# Patient Record
Sex: Female | Born: 1980 | Race: Asian | Hispanic: No | Marital: Married | Smoking: Never smoker
Health system: Southern US, Community
[De-identification: ages and names within clinical notes are randomized; demographics above are authoritative.]

## PROBLEM LIST (undated history)

## (undated) ENCOUNTER — Inpatient Hospital Stay (HOSPITAL_COMMUNITY): Payer: Self-pay

## (undated) DIAGNOSIS — E079 Disorder of thyroid, unspecified: Secondary | ICD-10-CM

## (undated) DIAGNOSIS — E611 Iron deficiency: Secondary | ICD-10-CM

## (undated) HISTORY — DX: Iron deficiency: E61.1

---

## 2005-08-28 ENCOUNTER — Ambulatory Visit: Payer: Self-pay | Admitting: Gynecology

## 2005-08-28 ENCOUNTER — Inpatient Hospital Stay (HOSPITAL_COMMUNITY): Admission: EM | Admit: 2005-08-28 | Discharge: 2005-09-02 | Payer: Self-pay | Admitting: Obstetrics and Gynecology

## 2011-07-09 ENCOUNTER — Encounter (HOSPITAL_COMMUNITY): Payer: Self-pay | Admitting: *Deleted

## 2011-07-09 ENCOUNTER — Emergency Department (HOSPITAL_COMMUNITY)
Admission: EM | Admit: 2011-07-09 | Discharge: 2011-07-09 | Disposition: A | Discharge status: Home / Self Care | Payer: BC Managed Care – PPO | Source: Home / Self Care | Attending: Emergency Medicine | Admitting: Emergency Medicine

## 2011-07-09 DIAGNOSIS — S335XXA Sprain of ligaments of lumbar spine, initial encounter: Secondary | ICD-10-CM

## 2011-07-09 DIAGNOSIS — S39012A Strain of muscle, fascia and tendon of lower back, initial encounter: Secondary | ICD-10-CM

## 2011-07-09 HISTORY — DX: Disorder of thyroid, unspecified: E07.9

## 2011-07-09 LAB — POCT URINALYSIS DIP (DEVICE)
Glucose, UA: NEGATIVE mg/dL
Hgb urine dipstick: NEGATIVE
Leukocytes, UA: NEGATIVE
Nitrite: NEGATIVE
Protein, ur: NEGATIVE mg/dL
pH: 5.5 (ref 5.0–8.0)

## 2011-07-09 LAB — POCT PREGNANCY, URINE: Preg Test, Ur: NEGATIVE

## 2011-07-09 MED ORDER — TRAMADOL HCL 50 MG PO TABS: 100.0000 mg | ORAL_TABLET | Freq: Three times a day (TID) | ORAL | Status: AC | PRN

## 2011-07-09 MED ORDER — METHOCARBAMOL 500 MG PO TABS: 500.0000 mg | ORAL_TABLET | Freq: Three times a day (TID) | ORAL | Status: AC

## 2011-07-09 MED ORDER — NAPROXEN 375 MG PO TABS: 375.0000 mg | ORAL_TABLET | Freq: Two times a day (BID) | ORAL | Status: AC

## 2011-07-09 NOTE — ED Notes (Signed)
Pt  Reports  Low      Back pain  With  What  She  descibes  As   Some    Burning on  Urination  X  sev  Days   -  denys  Any  Sp[ecefic  Injury   She  Walks  upight with  Steady  Gait        Appears  In no   Severe  Distress

## 2011-07-09 NOTE — ED Provider Notes (Signed)
Chief Complaint  Patient presents with  . 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 Ever since yesterday she's had pain in the lower back. This might have been brought on by lifting heavy pots and pans. It's worse with heavy lifting and bending. She denies any radiation to the legs, numbness, tingling, or weakness in the legs. She has no bladder symptoms such as dysuria, frequency, urgency, or hematuria. She does note a little bit of aching in her abdomen and chest and feels tired and rundown. No fever or weight loss.  Review of Systems:  Other than noted above, the patient denies any of the following symptoms: Systemic:  No fever, chills, fatigue, or weight loss. GI:  No abdominal pain, nausea, vomiting, diarrhea, constipation or blood in stool. GU:  No dysuria, frequency, urgency, or hematuria. No incontinence or difficulty urinating.  M-S:  No neck pain, joint pain, arthritis, or myalgias. Neuro:  No parethesias or muscular weakness. Skin:  No rash or itching.   PMFSH:  Past medical history, family history, social history, meds, and allergies were reviewed.  Physical Exam:   Vital signs:  BP 116/72  Pulse 82  Temp(Src) 97.7 F (36.5 C) (Oral)  Resp 16  SpO2 99%  LMP 06/19/2011 General:  Alert, oriented, in no distress. Abdomen:  Soft, non-tender.  No organomegaly or mass.  No pulsatile midline abdominal mass or bruit. Back:  She has mild tenderness to palpation in the mid lumbar spine. The back has a very good range of motion with some pain on forward, lateral, backward bending. Straight leg raising is negative. Neuro:  Normal muscle strength, sensations and DTRs. Skin:  Clear, warm and dry.  No rash.  Labs:   Results for orders placed during the hospital encounter of 07/09/11  POCT URINALYSIS DIP (DEVICE)      Component Value Range   Glucose, UA NEGATIVE  NEGATIVE (mg/dL)   Bilirubin Urine NEGATIVE   NEGATIVE    Ketones, ur NEGATIVE  NEGATIVE (mg/dL)   Specific Gravity, Urine 1.020  1.005 - 1.030    Hgb urine dipstick NEGATIVE  NEGATIVE    pH 5.5  5.0 - 8.0    Protein, ur NEGATIVE  NEGATIVE (mg/dL)   Urobilinogen, UA 0.2  0.0 - 1.0 (mg/dL)   Nitrite NEGATIVE  NEGATIVE    Leukocytes, UA NEGATIVE  NEGATIVE   POCT PREGNANCY, URINE      Component Value Range   Preg Test, Ur NEGATIVE  NEGATIVE      Radiology:  No results found.  Assessment:   Diagnoses that have been ruled out:  None  Diagnoses that are still under consideration:  None  Final diagnoses:  Lumbar strain    Plan:   1.  The following meds were prescribed:   New Prescriptions   METHOCARBAMOL (ROBAXIN) 500 MG TABLET    Take 1 tablet (500 mg total) by mouth 3 (three) times daily.   NAPROXEN (NAPROSYN) 375 MG TABLET    Take 1 tablet (375 mg total) by mouth 2 (two) times daily with a meal.   TRAMADOL (ULTRAM) 50 MG TABLET    Take 2 tablets (100 mg total) by mouth every 8 (eight) hours as needed for pain.   2.  The patient was instructed in symptomatic care and handouts were given. 3.  The patient was told to return if becoming worse in any way, if no  better in 3 or 4 days, and given some red flag symptoms that would indicate earlier return.    Roque Lias, MD 07/09/11 [phone]

## 2011-07-09 NOTE — Discharge Instructions (Signed)
Back Exercises Back exercises help treat and prevent back injuries. The goal of back exercises is to increase the strength of your abdominal and back muscles and the flexibility of your back. These exercises should be started when you no longer have back pain. Back exercises include:  Pelvic Tilt. Lie on your back with your knees bent. Tilt your pelvis until the lower part of your back is against the floor. Hold this position 5 to 10 sec and repeat 5 to 10 times.   Knee to Chest. Pull first 1 knee up against your chest and hold for 20 to 30 seconds, repeat this with the other knee, and then both knees. This may be done with the other leg straight or bent, whichever feels better.   Sit-Ups or Curl-Ups. Bend your knees 90 degrees. Start with tilting your pelvis, and do a partial, slow sit-up, lifting your trunk only 30 to 45 degrees off the floor. Take at least 2 to 3 seconds for each sit-up. Do not do sit-ups with your knees out straight. If partial sit-ups are difficult, simply do the above but with only tightening your abdominal muscles and holding it as directed.   Hip-Lift. Lie on your back with your knees flexed 90 degrees. Push down with your feet and shoulders as you raise your hips a couple inches off the floor; hold for 10 seconds, repeat 5 to 10 times.   Back arches. Lie on your stomach, propping yourself up on bent elbows. Slowly press on your hands, causing an arch in your low back. Repeat 3 to 5 times. Any initial stiffness and discomfort should lessen with repetition over time.   Shoulder-Lifts. Lie face down with arms beside your body. Keep hips and torso pressed to floor as you slowly lift your head and shoulders off the floor.  Do not overdo your exercises, especially in the beginning. Exercises may cause you some mild back discomfort which lasts for a few minutes; however, if the pain is more severe, or lasts for more than 15 minutes, do not continue exercises until you see your  caregiver. Improvement with exercise therapy for back problems is slow.  See your caregivers for assistance with developing a proper back exercise program. Document Released: 05/28/2004 Document Revised: 04/09/2011 Document Reviewed: 04/20/2005 [employer] Patient Information 2012 [employer]. 

## 2012-03-29 ENCOUNTER — Encounter: Payer: Self-pay | Admitting: Family Medicine

## 2012-03-29 ENCOUNTER — Ambulatory Visit (INDEPENDENT_AMBULATORY_CARE_PROVIDER_SITE_OTHER): Payer: BC Managed Care – PPO | Admitting: Family Medicine

## 2012-03-29 VITALS — BP 100/64 | HR 80 | Temp 97.9°F | Ht 59.5 in | Wt 99.2 lb

## 2012-03-29 DIAGNOSIS — R5383 Other fatigue: Secondary | ICD-10-CM

## 2012-03-29 DIAGNOSIS — R5381 Other malaise: Secondary | ICD-10-CM

## 2012-03-29 DIAGNOSIS — D649 Anemia, unspecified: Secondary | ICD-10-CM

## 2012-03-29 DIAGNOSIS — N943 Premenstrual tension syndrome: Secondary | ICD-10-CM | POA: Insufficient documentation

## 2012-03-29 DIAGNOSIS — N390 Urinary tract infection, site not specified: Secondary | ICD-10-CM

## 2012-03-29 DIAGNOSIS — Z0000 Encounter for general adult medical examination without abnormal findings: Secondary | ICD-10-CM

## 2012-03-29 LAB — BASIC METABOLIC PANEL
BUN: 12 mg/dL (ref 6–23)
CO2: 27 mEq/L (ref 19–32)
Calcium: 8.8 mg/dL (ref 8.4–10.5)
Chloride: 105 mEq/L (ref 96–112)
GFR: 108.14 mL/min (ref >60.00)
Potassium: 3.9 mEq/L (ref 3.5–5.1)

## 2012-03-29 LAB — POCT URINALYSIS DIPSTICK
Bilirubin, UA: NEGATIVE
Glucose, UA: NEGATIVE
Nitrite, UA: NEGATIVE
Protein, UA: NEGATIVE
Spec Grav, UA: 1.01
Urobilinogen, UA: 0.2
pH, UA: 6

## 2012-03-29 LAB — LIPID PANEL
LDL Cholesterol: 73 mg/dL (ref 0–99)
Total CHOL/HDL Ratio: 3

## 2012-03-29 LAB — CBC WITH DIFFERENTIAL/PLATELET
Basophils Absolute: 0 10*3/uL (ref 0.0–0.1)
Basophils Relative: 0.6 % (ref 0.0–3.0)
HCT: 38.1 % (ref 36.0–46.0)
Hemoglobin: 12.4 g/dL (ref 12.0–15.0)
MCHC: 32.6 g/dL (ref 30.0–36.0)
Monocytes Absolute: 0.2 10*3/uL (ref 0.1–1.0)
Monocytes Relative: 4.5 % (ref 3.0–12.0)
Neutrophils Relative %: 65.3 % (ref 43.0–77.0)
RBC: 4.03 Mil/uL (ref 3.87–5.11)
WBC: 5.4 10*3/uL (ref 4.5–10.5)

## 2012-03-29 LAB — IBC PANEL
Iron: 82 ug/dL (ref 42–145)
Saturation Ratios: 26 % (ref 20.0–50.0)
Transferrin: 225 mg/dL (ref 212.0–360.0)

## 2012-03-29 LAB — TSH: TSH: 0.65 u[IU]/mL (ref 0.35–5.50)

## 2012-03-29 NOTE — Progress Notes (Signed)
Subjective:     Shelly Olsen is a 31 y.o. female and is here for a comprehensive physical exam. The patient reports problems - cramping with period and back pain.  Pt also c/o fatigue for many years.  .  History   Social History  . Marital Status: Married    Spouse Name: N/A    Number of Children: N/A  . Years of Education: N/A   Occupational History  . Not on file.   Social History Main Topics  . Smoking status: Never Smoker   . Smokeless tobacco: Not on file  . Alcohol Use: No  . Drug Use:   . Sexually Active: Yes   Other Topics Concern  . Not on file   Social History Narrative  . No narrative on file   No health maintenance topics applied.  The following portions of the patient's history were reviewed and updated as appropriate:  She  has a past medical history of Thyroid disease and Low iron. She  does not have a problem list on file. She  has no past surgical history on file. Her family history is not on file. She  reports that she has never smoked. She does not have any smokeless tobacco history on file. She reports that she does not drink alcohol. Her drug history not on file. She has a current medication list which includes the following prescription(s): naproxen and ferrous fumarate. Current Outpatient Prescriptions on File Prior to Visit  Medication Sig Dispense Refill  . naproxen (NAPROSYN) 375 MG tablet Take 1 tablet (375 mg total) by mouth 2 (two) times daily with a meal.  20 tablet  0  . ferrous fumarate (HEMOCYTE - 106 MG FE) 325 (106 FE) MG TABS Take 1 tablet by mouth.       She  has no known allergies..  Review of Systems Review of Systems  Constitutional: Negative for activity change, appetite change and fatigue.  HENT: Negative for hearing loss, congestion, tinnitus and ear discharge.  dentist --no Eyes: Negative for visual disturbance (see optho -no) Respiratory: Negative for cough, chest tightness and shortness of breath.   Cardiovascular:  Negative for chest pain, palpitations and leg swelling.  Gastrointestinal: Negative for abdominal pain, diarrhea, constipation and abdominal distention.  Genitourinary: Negative for urgency, frequency, decreased urine volume and difficulty urinating.  Musculoskeletal: Negative for back pain, arthralgias and gait problem.  Skin: Negative for color change, pallor and rash.  Neurological: Negative for dizziness, light-headedness, numbness and headaches.  Hematological: Negative for adenopathy. Does not bruise/bleed easily.  Psychiatric/Behavioral: Negative for suicidal ideas, confusion, sleep disturbance, self-injury, dysphoric mood, decreased concentration and agitation.       Objective:    BP 100/64  Pulse 80  Temp 97.9 F (36.6 C) (Oral)  Ht 4' 11.5" (1.511 m)  Wt 99 lb 3.2 oz (44.997 kg)  BMI 19.70 kg/m2  SpO2 99%  LMP 03/29/2012 General appearance: alert, cooperative, appears stated age and no distress Head: Normocephalic, without obvious abnormality, atraumatic Eyes: conjunctivae/corneas clear. PERRL, EOM's intact. Fundi benign. Ears: normal TM's and external ear canals both ears Nose: Nares normal. Septum midline. Mucosa normal. No drainage or sinus tenderness. Throat: lips, mucosa, and tongue normal; teeth and gums normal Neck: no adenopathy, supple, symmetrical, trachea midline and thyroid not enlarged, symmetric, no tenderness/mass/nodules Back: symmetric, no curvature. ROM normal. No CVA tenderness. Lungs: clear to auscultation bilaterally Breasts: normal appearance, no masses or tenderness Heart: regular rate and rhythm, S1, S2 normal, no murmur, click, rub  or gallop Abdomen: soft, non-tender; bowel sounds normal; no masses,  no organomegaly Pelvic: deferred Extremities: extremities normal, atraumatic, no cyanosis or edema Pulses: 2+ and symmetric Skin: Skin color, texture, turgor normal. No rashes or lesions Lymph nodes: Cervical, supraclavicular, and axillary nodes  normal. Neurologic: Grossly normal psych-- no anxiety/ depression    Assessment:    Healthy female exam.      Plan:    ghm utd Check labs See After Visit Summary for Counseling Recommendations

## 2012-03-29 NOTE — Addendum Note (Signed)
Addended by: Silvio Pate D on: 03/29/2012 03:30 PM   Modules accepted: Orders

## 2012-03-29 NOTE — Patient Instructions (Addendum)
Preventive Care for Adults, Female A healthy lifestyle and preventive care can promote health and wellness. Preventive health guidelines for women include the following key practices.  A routine yearly physical is a good way to check with your caregiver about your health and preventive screening. It is a chance to share any concerns and updates on your health, and to receive a thorough exam.  Visit your dentist for a routine exam and preventive care every 6 months. Brush your teeth twice a day and floss once a day. Good oral hygiene prevents tooth decay and gum disease.  The frequency of eye exams is based on your age, health, family medical history, use of contact lenses, and other factors. Follow your caregiver's recommendations for frequency of eye exams.  Eat a healthy diet. Foods like vegetables, fruits, whole grains, low-fat dairy products, and lean protein foods contain the nutrients you need without too many calories. Decrease your intake of foods high in solid fats, added sugars, and salt. Eat the right amount of calories for you.Get information about a proper diet from your caregiver, if necessary.  Regular physical exercise is one of the most important things you can do for your health. Most adults should get at least 150 minutes of moderate-intensity exercise (any activity that increases your heart rate and causes you to sweat) each week. In addition, most adults need muscle-strengthening exercises on 2 or more days a week.  Maintain a healthy weight. The body mass index (BMI) is a screening tool to identify possible weight problems. It provides an estimate of body fat based on height and weight. Your caregiver can help determine your BMI, and can help you achieve or maintain a healthy weight.For adults 20 years and older:  A BMI below 18.5 is considered underweight.  A BMI of 18.5 to 24.9 is normal.  A BMI of 25 to 29.9 is considered overweight.  A BMI of 30 and above is  considered obese.  Maintain normal blood lipids and cholesterol levels by exercising and minimizing your intake of saturated fat. Eat a balanced diet with plenty of fruit and vegetables. Blood tests for lipids and cholesterol should begin at age 20 and be repeated every 5 years. If your lipid or cholesterol levels are high, you are over 50, or you are at high risk for heart disease, you may need your cholesterol levels checked more frequently.Ongoing high lipid and cholesterol levels should be treated with medicines if diet and exercise are not effective.  If you smoke, find out from your caregiver how to quit. If you do not use tobacco, do not start.  If you are pregnant, do not drink alcohol. If you are breastfeeding, be very cautious about drinking alcohol. If you are not pregnant and choose to drink alcohol, do not exceed 1 drink per day. One drink is considered to be 12 ounces (355 mL) of beer, 5 ounces (148 mL) of wine, or 1.5 ounces (44 mL) of liquor.  Avoid use of street drugs. Do not share needles with anyone. Ask for help if you need support or instructions about stopping the use of drugs.  High blood pressure causes heart disease and increases the risk of stroke. Your blood pressure should be checked at least every 1 to 2 years. Ongoing high blood pressure should be treated with medicines if weight loss and exercise are not effective.  If you are 55 to 32 years old, ask your caregiver if you should take aspirin to prevent strokes.  Diabetes   screening involves taking a blood sample to check your fasting blood sugar level. This should be done once every 3 years, after age 45, if you are within normal weight and without risk factors for diabetes. Testing should be considered at a younger age or be carried out more frequently if you are overweight and have at least 1 risk factor for diabetes.  Breast cancer screening is essential preventive care for women. You should practice "breast  self-awareness." This means understanding the normal appearance and feel of your breasts and may include breast self-examination. Any changes detected, no matter how small, should be reported to a caregiver. Women in their 20s and 30s should have a clinical breast exam (CBE) by a caregiver as part of a regular health exam every 1 to 3 years. After age 40, women should have a CBE every year. Starting at age 40, women should consider having a mammography (breast X-ray test) every year. Women who have a family history of breast cancer should talk to their caregiver about genetic screening. Women at a high risk of breast cancer should talk to their caregivers about having magnetic resonance imaging (MRI) and a mammography every year.  The Pap test is a screening test for cervical cancer. A Pap test can show cell changes on the cervix that might become cervical cancer if left untreated. A Pap test is a procedure in which cells are obtained and examined from the lower end of the uterus (cervix).  Women should have a Pap test starting at age 21.  Between ages 21 and 29, Pap tests should be repeated every 2 years.  Beginning at age 30, you should have a Pap test every 3 years as long as the past 3 Pap tests have been normal.  Some women have medical problems that increase the chance of getting cervical cancer. Talk to your caregiver about these problems. It is especially important to talk to your caregiver if a new problem develops soon after your last Pap test. In these cases, your caregiver may recommend more frequent screening and Pap tests.  The above recommendations are the same for women who have or have not gotten the vaccine for human papillomavirus (HPV).  If you had a hysterectomy for a problem that was not cancer or a condition that could lead to cancer, then you no longer need Pap tests. Even if you no longer need a Pap test, a regular exam is a good idea to make sure no other problems are  starting.  If you are between ages 65 and 70, and you have had normal Pap tests going back 10 years, you no longer need Pap tests. Even if you no longer need a Pap test, a regular exam is a good idea to make sure no other problems are starting.  If you have had past treatment for cervical cancer or a condition that could lead to cancer, you need Pap tests and screening for cancer for at least 20 years after your treatment.  If Pap tests have been discontinued, risk factors (such as a new sexual partner) need to be reassessed to determine if screening should be resumed.  The HPV test is an additional test that may be used for cervical cancer screening. The HPV test looks for the virus that can cause the cell changes on the cervix. The cells collected during the Pap test can be tested for HPV. The HPV test could be used to screen women aged 30 years and older, and should   be used in women of any age who have unclear Pap test results. After the age of 30, women should have HPV testing at the same frequency as a Pap test.  Colorectal cancer can be detected and often prevented. Most routine colorectal cancer screening begins at the age of 50 and continues through age 75. However, your caregiver may recommend screening at an earlier age if you have risk factors for colon cancer. On a yearly basis, your caregiver may provide home test kits to check for hidden blood in the stool. Use of a small camera at the end of a tube, to directly examine the colon (sigmoidoscopy or colonoscopy), can detect the earliest forms of colorectal cancer. Talk to your caregiver about this at age 50, when routine screening begins. Direct examination of the colon should be repeated every 5 to 10 years through age 75, unless early forms of pre-cancerous polyps or small growths are found.  Hepatitis C blood testing is recommended for all people born from 1945 through 1965 and any individual with known risks for hepatitis C.  Practice  safe sex. Use condoms and avoid high-risk sexual practices to reduce the spread of sexually transmitted infections (STIs). STIs include gonorrhea, chlamydia, syphilis, trichomonas, herpes, HPV, and human immunodeficiency virus (HIV). Herpes, HIV, and HPV are viral illnesses that have no cure. They can result in disability, cancer, and death. Sexually active women aged 25 and younger should be checked for chlamydia. Older women with new or multiple partners should also be tested for chlamydia. Testing for other STIs is recommended if you are sexually active and at increased risk.  Osteoporosis is a disease in which the bones lose minerals and strength with aging. This can result in serious bone fractures. The risk of osteoporosis can be identified using a bone density scan. Women ages 65 and over and women at risk for fractures or osteoporosis should discuss screening with their caregivers. Ask your caregiver whether you should take a calcium supplement or vitamin D to reduce the rate of osteoporosis.  Menopause can be associated with physical symptoms and risks. Hormone replacement therapy is available to decrease symptoms and risks. You should talk to your caregiver about whether hormone replacement therapy is right for you.  Use sunscreen with sun protection factor (SPF) of 30 or more. Apply sunscreen liberally and repeatedly throughout the day. You should seek shade when your shadow is shorter than you. Protect yourself by wearing long sleeves, pants, a wide-brimmed hat, and sunglasses year round, whenever you are outdoors.  Once a month, do a whole body skin exam, using a mirror to look at the skin on your back. Notify your caregiver of new moles, moles that have irregular borders, moles that are larger than a pencil eraser, or moles that have changed in shape or color.  Stay current with required immunizations.  Influenza. You need a dose every fall (or winter). The composition of the flu vaccine  changes each year, so being vaccinated once is not enough.  Pneumococcal polysaccharide. You need 1 to 2 doses if you smoke cigarettes or if you have certain chronic medical conditions. You need 1 dose at age 65 (or older) if you have never been vaccinated.  Tetanus, diphtheria, pertussis (Tdap, Td). Get 1 dose of Tdap vaccine if you are younger than age 65, are over 65 and have contact with an infant, are a healthcare worker, are pregnant, or simply want to be protected from whooping cough. After that, you need a Td   booster dose every 10 years. Consult your caregiver if you have not had at least 3 tetanus and diphtheria-containing shots sometime in your life or have a deep or dirty wound.  HPV. You need this vaccine if you are a woman age 26 or younger. The vaccine is given in 3 doses over 6 months.  Measles, mumps, rubella (MMR). You need at least 1 dose of MMR if you were born in 1957 or later. You may also need a second dose.  Meningococcal. If you are age 19 to 21 and a first-year college student living in a residence hall, or have one of several medical conditions, you need to get vaccinated against meningococcal disease. You may also need additional booster doses.  Zoster (shingles). If you are age 60 or older, you should get this vaccine.  Varicella (chickenpox). If you have never had chickenpox or you were vaccinated but received only 1 dose, talk to your caregiver to find out if you need this vaccine.  Hepatitis A. You need this vaccine if you have a specific risk factor for hepatitis A virus infection or you simply wish to be protected from this disease. The vaccine is usually given as 2 doses, 6 to 18 months apart.  Hepatitis B. You need this vaccine if you have a specific risk factor for hepatitis B virus infection or you simply wish to be protected from this disease. The vaccine is given in 3 doses, usually over 6 months. Preventive Services / Frequency Ages 19 to 39  Blood  pressure check.** / Every 1 to 2 years.  Lipid and cholesterol check.** / Every 5 years beginning at age 20.  Clinical breast exam.** / Every 3 years for women in their 20s and 30s.  Pap test.** / Every 2 years from ages 21 through 29. Every 3 years starting at age 30 through age 65 or 70 with a history of 3 consecutive normal Pap tests.  HPV screening.** / Every 3 years from ages 30 through ages 65 to 70 with a history of 3 consecutive normal Pap tests.  Hepatitis C blood test.** / For any individual with known risks for hepatitis C.  Skin self-exam. / Monthly.  Influenza immunization.** / Every year.  Pneumococcal polysaccharide immunization.** / 1 to 2 doses if you smoke cigarettes or if you have certain chronic medical conditions.  Tetanus, diphtheria, pertussis (Tdap, Td) immunization. / A one-time dose of Tdap vaccine. After that, you need a Td booster dose every 10 years.  HPV immunization. / 3 doses over 6 months, if you are 26 and younger.  Measles, mumps, rubella (MMR) immunization. / You need at least 1 dose of MMR if you were born in 1957 or later. You may also need a second dose.  Meningococcal immunization. / 1 dose if you are age 19 to 21 and a first-year college student living in a residence hall, or have one of several medical conditions, you need to get vaccinated against meningococcal disease. You may also need additional booster doses.  Varicella immunization.** / Consult your caregiver.  Hepatitis A immunization.** / Consult your caregiver. 2 doses, 6 to 18 months apart.  Hepatitis B immunization.** / Consult your caregiver. 3 doses usually over 6 months. Ages 40 to 64  Blood pressure check.** / Every 1 to 2 years.  Lipid and cholesterol check.** / Every 5 years beginning at age 20.  Clinical breast exam.** / Every year after age 40.  Mammogram.** / Every year beginning at age 40   and continuing for as long as you are in good health. Consult with your  caregiver.  Pap test.** / Every 3 years starting at age 30 through age 65 or 70 with a history of 3 consecutive normal Pap tests.  HPV screening.** / Every 3 years from ages 30 through ages 65 to 70 with a history of 3 consecutive normal Pap tests.  Fecal occult blood test (FOBT) of stool. / Every year beginning at age 50 and continuing until age 75. You may not need to do this test if you get a colonoscopy every 10 years.  Flexible sigmoidoscopy or colonoscopy.** / Every 5 years for a flexible sigmoidoscopy or every 10 years for a colonoscopy beginning at age 50 and continuing until age 75.  Hepatitis C blood test.** / For all people born from 1945 through 1965 and any individual with known risks for hepatitis C.  Skin self-exam. / Monthly.  Influenza immunization.** / Every year.  Pneumococcal polysaccharide immunization.** / 1 to 2 doses if you smoke cigarettes or if you have certain chronic medical conditions.  Tetanus, diphtheria, pertussis (Tdap, Td) immunization.** / A one-time dose of Tdap vaccine. After that, you need a Td booster dose every 10 years.  Measles, mumps, rubella (MMR) immunization. / You need at least 1 dose of MMR if you were born in 1957 or later. You may also need a second dose.  Varicella immunization.** / Consult your caregiver.  Meningococcal immunization.** / Consult your caregiver.  Hepatitis A immunization.** / Consult your caregiver. 2 doses, 6 to 18 months apart.  Hepatitis B immunization.** / Consult your caregiver. 3 doses, usually over 6 months. Ages 65 and over  Blood pressure check.** / Every 1 to 2 years.  Lipid and cholesterol check.** / Every 5 years beginning at age 20.  Clinical breast exam.** / Every year after age 40.  Mammogram.** / Every year beginning at age 40 and continuing for as long as you are in good health. Consult with your caregiver.  Pap test.** / Every 3 years starting at age 30 through age 65 or 70 with a 3  consecutive normal Pap tests. Testing can be stopped between 65 and 70 with 3 consecutive normal Pap tests and no abnormal Pap or HPV tests in the past 10 years.  HPV screening.** / Every 3 years from ages 30 through ages 65 or 70 with a history of 3 consecutive normal Pap tests. Testing can be stopped between 65 and 70 with 3 consecutive normal Pap tests and no abnormal Pap or HPV tests in the past 10 years.  Fecal occult blood test (FOBT) of stool. / Every year beginning at age 50 and continuing until age 75. You may not need to do this test if you get a colonoscopy every 10 years.  Flexible sigmoidoscopy or colonoscopy.** / Every 5 years for a flexible sigmoidoscopy or every 10 years for a colonoscopy beginning at age 50 and continuing until age 75.  Hepatitis C blood test.** / For all people born from 1945 through 1965 and any individual with known risks for hepatitis C.  Osteoporosis screening.** / A one-time screening for women ages 65 and over and women at risk for fractures or osteoporosis.  Skin self-exam. / Monthly.  Influenza immunization.** / Every year.  Pneumococcal polysaccharide immunization.** / 1 dose at age 65 (or older) if you have never been vaccinated.  Tetanus, diphtheria, pertussis (Tdap, Td) immunization. / A one-time dose of Tdap vaccine if you are over   65 and have contact with an infant, are a Research scientist (physical sciences), or simply want to be protected from whooping cough. After that, you need a Td booster dose every 10 years.  Varicella immunization.** / Consult your caregiver.  Meningococcal immunization.** / Consult your caregiver.  Hepatitis A immunization.** / Consult your caregiver. 2 doses, 6 to 18 months apart.  Hepatitis B immunization.** / Check with your caregiver. 3 doses, usually over 6 months. ** Family history and personal history of risk and conditions may change your caregiver's recommendations. Document Released: 06/16/2001 Document Revised: 07/13/2011  Document Reviewed: 09/15/2010 [facility] Patient Information 2013 [place], [place].  Premenstrual Syndrome Premenstrual syndrome (PMS) or premenstrual disphoric disorder (PMDD) is a mix of emotional and clinical symptoms. PMS occurs 10 to 14 days before the start of a menstrual period. Common symptoms include pelvic pain, headache and mood changes. Most women have PMS to some degree.  CAUSES  The cause is unknown. There is evidence that it is related to the female hormones during the second half of the menstrual cycle. These hormones fluctuate and are thought to affect chemicals in the brain (serotonin) that can influence a person's mood. SYMPTOMS  Symptoms may include any of the following:  Headache.  Swelling of hands and feet.  Abdominal bloating.  Tiredness.  Breast tenderness.  Depression.  Crying spells.  Anxiety.  Irritability.  Confusion.  Joint and muscle pains.  Forgetfulness.  Withdrawal from family, friends and activities. DIAGNOSIS  Diagnosis is made by your caregiver who will ask you questions about the kind of symptoms you are having, when they occur and what may bring them on. If your are having any of the symptoms listed above that occur 10 to 14 days before your menstrual period, it is strong evidence you have PMS. TREATMENT   Only take over-the-counter or prescription medicines for pain, discomfort or fever as directed by your caregiver.  Oral contraceptives.  Hormone therapy.  Medications that slow down the production of serotonin in the brain (fluoxetine, sertraline and others).  Diuretics. These get rid of extra fluid from your body.  Anti-depression medication when necessary.  Surgery to remove both ovaries. This is a last resort and if no further pregnancies are wanted.  Consider counseling or joining a PMS therapy support group. HOME CARE INSTRUCTIONS   Exercise regularly as suggested by your caregiver. Exercise especially before your  menstrual period.  Eat a regular, well-balanced diet rich in carbohydrates.  Restrict or eliminate caffeine, alcohol and tobacco consumption.  Be sure to get enough sleep. Practice relaxation techniques.  Drink 64 oz. fluids per day. This is 8 glasses, 8 oz. each. It is best to drink water.  Eliminate known stressors in your life.  Attend relationship or parenting counseling, if needed.  Take a multi-vitamin in the recommended daily dosages.  Calcium, magnesium, vitamin B6 and vitamin E are some times helpful for PMS symptoms.  Take medications as suggested by your caregiver. SEEK MEDICAL CARE IF:   You need medication for excessive swelling, depression, severe headaches, or because you cannot sleep.  You need help from your caregiver to help you decide if you need to have your ovaries removed because none of your treatment is helping you. Document Released: 04/17/2000 Document Revised: 07/13/2011 Document Reviewed: 09/07/2011 [facility] Patient Information 2013 [place], [place].

## 2012-03-29 NOTE — Assessment & Plan Note (Signed)
Check labs  Hx anemia   

## 2012-03-29 NOTE — Assessment & Plan Note (Signed)
nsaids Heating pad

## 2012-03-31 LAB — URINE CULTURE: Colony Count: 30000

## 2012-04-18 ENCOUNTER — Other Ambulatory Visit (HOSPITAL_COMMUNITY)
Admission: RE | Admit: 2012-04-18 | Discharge: 2012-04-18 | Disposition: A | Discharge status: Home / Self Care | Payer: BC Managed Care – PPO | Source: Ambulatory Visit | Attending: Family Medicine | Admitting: Family Medicine

## 2012-04-18 ENCOUNTER — Ambulatory Visit (INDEPENDENT_AMBULATORY_CARE_PROVIDER_SITE_OTHER): Payer: BC Managed Care – PPO | Admitting: Family Medicine

## 2012-04-18 ENCOUNTER — Encounter: Payer: Self-pay | Admitting: Family Medicine

## 2012-04-18 VITALS — BP 108/66 | HR 64 | Temp 98.1°F | Wt 97.2 lb

## 2012-04-18 DIAGNOSIS — Z01419 Encounter for gynecological examination (general) (routine) without abnormal findings: Secondary | ICD-10-CM | POA: Insufficient documentation

## 2012-04-18 NOTE — Progress Notes (Signed)
  Subjective:     Shelly Olsen is a 31 y.o. woman who comes in today for a  pap smear only. Her most recent annual exam was on 03/29/2012. Her most recent Pap smear was 1 year go and showed no abnormalities. Previous abnormal Pap smears: no. Contraception: none  The following portions of the patient's history were reviewed and updated as appropriate: allergies, current medications, past family history, past medical history, past social history, past surgical history and problem list.  Review of Systems Pertinent items are noted in HPI.   Objective:    BP 108/66  Pulse 64  Temp 98.1 F (36.7 C) (Oral)  Wt 97 lb 3.2 oz (44.09 kg)  SpO2 98%  LMP 03/29/2012 Pelvic Exam: cervix normal in appearance, external genitalia normal, no adnexal masses or tenderness, no cervical motion tenderness and vagina normal without discharge. Pap smear obtained.   Assessment:    Screening pap smear.   Plan:    Follow up in 1 year, or as indicated by Pap results.

## 2012-04-18 NOTE — Patient Instructions (Signed)

## 2012-04-18 NOTE — Addendum Note (Signed)
Addended by: Arnette Norris on: 04/18/2012 06:13 PM   Modules accepted: Orders

## 2012-12-08 ENCOUNTER — Ambulatory Visit (INDEPENDENT_AMBULATORY_CARE_PROVIDER_SITE_OTHER): Payer: BC Managed Care – PPO | Admitting: Emergency Medicine

## 2012-12-08 VITALS — BP 96/58 | HR 72 | Temp 97.9°F | Resp 16 | Ht 59.5 in | Wt 99.4 lb

## 2012-12-08 DIAGNOSIS — R112 Nausea with vomiting, unspecified: Secondary | ICD-10-CM

## 2012-12-08 DIAGNOSIS — H8110 Benign paroxysmal vertigo, unspecified ear: Secondary | ICD-10-CM

## 2012-12-08 LAB — POCT CBC
HCT, POC: 44.8 % (ref 37.7–47.9)
MCV: 97.7 fL — AB (ref 80–97)
MID (cbc): 0.5 (ref 0–0.9)
POC MID %: 7.5 %M (ref 0–12)
RDW, POC: 12.5 %

## 2012-12-08 MED ORDER — MECLIZINE HCL 25 MG PO TABS: 25.0000 mg | ORAL_TABLET | Freq: Three times a day (TID) | ORAL | Status: DC | PRN

## 2012-12-08 MED ORDER — ONDANSETRON 8 MG PO TBDP: 8.0000 mg | ORAL_TABLET | Freq: Three times a day (TID) | ORAL | Status: DC | PRN

## 2012-12-08 MED ORDER — MECLIZINE HCL 25 MG PO TABS
50.0000 mg | ORAL_TABLET | Freq: Three times a day (TID) | ORAL | Status: DC | PRN
Start: 2012-12-08 — End: 2012-12-08

## 2012-12-08 NOTE — Progress Notes (Signed)
[facility] [address] [place] [address] [phone]  Date:  12/08/2012   Name:  Shelly Olsen   DOB:  10/08/1980   MRN:  [id number]  PCP:  Loreen Freud, DO    Chief Complaint: Emesis, Chills and Dizziness   History of Present Illness:  Shelly Olsen is a 32 y.o. very pleasant female patient who presents with the following:  Ill since yesterday with nausea and vomiting.  Dizzy since yesterday.  No fever or chills, cough or coryza.  No stool change.  LMP 7/20 and she does not use contraception.  No head injury or LOC or visual symptoms.  No neuro symptoms.  No improvement with over the counter medications or other home remedies. Denies other complaint or health concern today.     Patient Active Problem List   Diagnosis Date Noted  . PMS (premenstrual syndrome) 03/29/2012  . Fatigue 03/29/2012    Past Medical History  Diagnosis Date  . Thyroid disease   . Low iron     History reviewed. No pertinent past surgical history.  History  Substance Use Topics  . Smoking status: Never Smoker   . Smokeless tobacco: Not on file  . Alcohol Use: No    History reviewed. No pertinent family history.  No Known Allergies  Medication list has been reviewed and updated.  Current Outpatient Prescriptions on File Prior to Visit  Medication Sig Dispense Refill  . ferrous fumarate (HEMOCYTE - 106 MG FE) 325 (106 FE) MG TABS Take 1 tablet by mouth.       No current facility-administered medications on file prior to visit.    Review of Systems:  As per HPI, otherwise negative.    Physical Examination: Filed Vitals:   12/08/12 1340  BP: 96/58  Pulse: 72  Temp: 97.9 F (36.6 C)  Resp: 16   Filed Vitals:   12/08/12 1340  Height: 4' 11.5" (1.511 m)  Weight: 99 lb 6.4 oz (45.088 kg)   Body mass index is 19.75 kg/(m^2). Ideal Body Weight: Weight in (lb) to have BMI = 25: 125.6  GEN: WDWN, NAD, Non-toxic, A & O x 3 HEENT: Atraumatic, Normocephalic.  Neck supple. No masses, No LAD. Ears and Nose: No external deformity. CV: RRR, No M/G/R. No JVD. No thrill. No extra heart sounds. PULM: CTA B, no wheezes, crackles, rhonchi. No retractions. No resp. distress. No accessory muscle use. BACK:  Extensive evidence recent cupping.   ABD: S, NT, ND, +BS. No rebound. No HSM. EXTR: No c/c/e NEURO Normal gait.  PSYCH: Normally interactive. Conversant. Not depressed or anxious appearing.  Calm demeanor.   Results for orders placed in visit on 12/08/12  POCT URINE PREGNANCY      Result Value Range   Preg Test, Ur Negative    POCT CBC      Result Value Range   WBC 6.9  4.6 - 10.2 K/uL   Lymph, poc 1.9  0.6 - 3.4   POC LYMPH PERCENT 27.7  10 - 50 %L   MID (cbc) 0.5  0 - 0.9   POC MID % 7.5  0 - 12 %M   POC Granulocyte 4.5  2 - 6.9   Granulocyte percent 64.8  37 - 80 %G   RBC 4.59  4.04 - 5.48 M/uL   Hemoglobin 14.2  12.2 - 16.2 g/dL   HCT, POC 13.2  44.0 - 47.9 %   MCV 97.7 (*) 80 - 97 fL  MCH, POC 30.9  27 - 31.2 pg   MCHC 31.7 (*) 31.8 - 35.4 g/dL   RDW, POC 16.1     Platelet Count, POC 193  142 - 424 K/uL   MPV 8.1  0 - 99.8 fL     Assessment and Plan: Benign positional vertigo   Signed,  Phillips Odor, MD   Results for orders placed in visit on 12/08/12  POCT URINE PREGNANCY      Result Value Range   Preg Test, Ur Negative    POCT CBC      Result Value Range   WBC 6.9  4.6 - 10.2 K/uL   Lymph, poc 1.9  0.6 - 3.4   POC LYMPH PERCENT 27.7  10 - 50 %L   MID (cbc) 0.5  0 - 0.9   POC MID % 7.5  0 - 12 %M   POC Granulocyte 4.5  2 - 6.9   Granulocyte percent 64.8  37 - 80 %G   RBC 4.59  4.04 - 5.48 M/uL   Hemoglobin 14.2  12.2 - 16.2 g/dL   HCT, POC 09.6  04.5 - 47.9 %   MCV 97.7 (*) 80 - 97 fL   MCH, POC 30.9  27 - 31.2 pg   MCHC 31.7 (*) 31.8 - 35.4 g/dL   RDW, POC 40.9     Platelet Count, POC 193  142 - 424 K/uL   MPV 8.1  0 - 99.8 fL

## 2012-12-08 NOTE — Patient Instructions (Addendum)

## 2013-04-05 ENCOUNTER — Emergency Department (HOSPITAL_COMMUNITY): Payer: BC Managed Care – PPO

## 2013-04-05 ENCOUNTER — Emergency Department (HOSPITAL_COMMUNITY)
Admission: EM | Admit: 2013-04-05 | Discharge: 2013-04-05 | Disposition: A | Discharge status: Home / Self Care | Payer: BC Managed Care – PPO | Attending: Emergency Medicine | Admitting: Emergency Medicine

## 2013-04-05 ENCOUNTER — Encounter (HOSPITAL_COMMUNITY): Payer: Self-pay | Admitting: Emergency Medicine

## 2013-04-05 DIAGNOSIS — Y9389 Activity, other specified: Secondary | ICD-10-CM | POA: Insufficient documentation

## 2013-04-05 DIAGNOSIS — R011 Cardiac murmur, unspecified: Secondary | ICD-10-CM | POA: Insufficient documentation

## 2013-04-05 DIAGNOSIS — S3981XA Other specified injuries of abdomen, initial encounter: Secondary | ICD-10-CM | POA: Insufficient documentation

## 2013-04-05 DIAGNOSIS — IMO0002 Reserved for concepts with insufficient information to code with codable children: Secondary | ICD-10-CM | POA: Insufficient documentation

## 2013-04-05 DIAGNOSIS — D509 Iron deficiency anemia, unspecified: Secondary | ICD-10-CM | POA: Insufficient documentation

## 2013-04-05 DIAGNOSIS — Y92410 Unspecified street and highway as the place of occurrence of the external cause: Secondary | ICD-10-CM | POA: Insufficient documentation

## 2013-04-05 DIAGNOSIS — S0990XA Unspecified injury of head, initial encounter: Secondary | ICD-10-CM | POA: Insufficient documentation

## 2013-04-05 DIAGNOSIS — Z862 Personal history of diseases of the blood and blood-forming organs and certain disorders involving the immune mechanism: Secondary | ICD-10-CM | POA: Insufficient documentation

## 2013-04-05 DIAGNOSIS — R0602 Shortness of breath: Secondary | ICD-10-CM | POA: Insufficient documentation

## 2013-04-05 DIAGNOSIS — Z8639 Personal history of other endocrine, nutritional and metabolic disease: Secondary | ICD-10-CM | POA: Insufficient documentation

## 2013-04-05 DIAGNOSIS — Z79899 Other long term (current) drug therapy: Secondary | ICD-10-CM | POA: Insufficient documentation

## 2013-04-05 MED ORDER — METHOCARBAMOL 500 MG PO TABS: 500.0000 mg | ORAL_TABLET | Freq: Two times a day (BID) | ORAL | Status: DC

## 2013-04-05 MED ORDER — IBUPROFEN 200 MG PO TABS
400.0000 mg | ORAL_TABLET | Freq: Once | ORAL | Status: AC
  Administered 2013-04-05: 400 mg via ORAL
  Filled 2013-04-05: qty 2

## 2013-04-05 MED ORDER — IBUPROFEN 400 MG PO TABS: 400.0000 mg | ORAL_TABLET | Freq: Four times a day (QID) | ORAL | Status: DC | PRN
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
## 2013-04-17 NOTE — ED Provider Notes (Signed)
Medical screening examination/treatment/procedure(s) were performed by non-physician practitioner and as supervising physician I was immediately available for consultation/collaboration.  EKG Interpretation   None        Donnetta Hutching, MD 04/17/13 [phone]

## 2013-05-04 NOTE — L&D Delivery Note (Signed)
Patient was C/C/+2and pushed for <10 minutes with epidural.   NSVD female infant, Apgars 9/9, weight pending.   The patient had no lacerations. Fundus was firm. EBL was expected. Placenta was delivered intact. Vagina was clear.  Baby was vigorous and doing skin to skin with mother.  Philip AspenALLAHAN, Marguerite Barba

## 2013-06-07 ENCOUNTER — Telehealth: Payer: Self-pay

## 2013-06-07 NOTE — Telephone Encounter (Signed)
Left message for call back. Non-identifiable.  Pap-04/18/12 Flu- Due Tdap-Due

## 2013-06-08 ENCOUNTER — Encounter: Payer: BC Managed Care – PPO | Admitting: Family Medicine

## 2013-06-12 NOTE — Telephone Encounter (Signed)
Patient cancelled appt and rescheduled for 08/07/13.

## 2013-07-31 ENCOUNTER — Encounter (HOSPITAL_COMMUNITY): Payer: Self-pay | Admitting: Emergency Medicine

## 2013-07-31 ENCOUNTER — Emergency Department (HOSPITAL_COMMUNITY)
Admission: EM | Admit: 2013-07-31 | Discharge: 2013-07-31 | Disposition: A | Discharge status: Home / Self Care | Payer: BC Managed Care – PPO | Source: Home / Self Care | Attending: Emergency Medicine | Admitting: Emergency Medicine

## 2013-07-31 DIAGNOSIS — Z3201 Encounter for pregnancy test, result positive: Secondary | ICD-10-CM

## 2013-07-31 DIAGNOSIS — Z3490 Encounter for supervision of normal pregnancy, unspecified, unspecified trimester: Secondary | ICD-10-CM

## 2013-07-31 LAB — POCT URINALYSIS DIP (DEVICE)
BILIRUBIN URINE: NEGATIVE
GLUCOSE, UA: NEGATIVE mg/dL
Hgb urine dipstick: NEGATIVE
Ketones, ur: NEGATIVE mg/dL
LEUKOCYTES UA: NEGATIVE
NITRITE: NEGATIVE
PH: 7 (ref 5.0–8.0)
PROTEIN: NEGATIVE mg/dL
Specific Gravity, Urine: 1.01 (ref 1.005–1.030)
Urobilinogen, UA: 0.2 mg/dL (ref 0.0–1.0)

## 2013-07-31 LAB — POCT PREGNANCY, URINE: PREG TEST UR: POSITIVE — AB

## 2013-07-31 MED ORDER — COMPLETENATE 29-1 MG PO CHEW: 1.0000 | CHEWABLE_TABLET | Freq: Every day | ORAL | Status: AC

## 2013-07-31 NOTE — Discharge Instructions (Signed)

## 2013-07-31 NOTE — ED Notes (Signed)
Pt here for pregnancy testing.  LMP 2/25.  Pt voices no concerns at this time.

## 2013-07-31 NOTE — ED Provider Notes (Signed)
CSN: [id number]     Arrival date & time 07/31/13  1543 History   First MD Initiated Contact with Patient 07/31/13 1755     Chief Complaint  Patient presents with  . Possible Pregnancy   (Consider location/radiation/quality/duration/timing/severity/associated sxs/prior Treatment) Patient is a 33 y.o. female presenting with pregnancy problem. The history is provided by the patient. No language interpreter was used.  Possible Pregnancy Episode onset: 1 month. The problem has been unchanged. Episode is moderate. Patient reports no abdominal pain.  Risk factors do not include advanced maternal age, diabetes or gestational diabetes.    Past Medical History  Diagnosis Date  . Thyroid disease   . Low iron    History reviewed. No pertinent past surgical history. History reviewed. No pertinent family history. History  Substance Use Topics  . Smoking status: Never Smoker   . Smokeless tobacco: Not on file  . Alcohol Use: Yes     Comment: occasional   OB History   Grav Para Term Preterm Abortions TAB SAB Ect Mult Living                 Review of Systems  Gastrointestinal: Negative for abdominal pain.  All other systems reviewed and are negative.    Allergies  Review of patient's allergies indicates no known allergies.  Home Medications   Current Outpatient Rx  Name  Route  Sig  Dispense  Refill  . ibuprofen (ADVIL,MOTRIN) 400 MG tablet   Oral   Take 1 tablet (400 mg total) by mouth every 6 (six) hours as needed.   30 tablet   0   . methocarbamol (ROBAXIN) 500 MG tablet   Oral   Take 1 tablet (500 mg total) by mouth 2 (two) times daily.   20 tablet   0    BP 113/73  Pulse 75  Temp(Src) 99.1 F (37.3 C) (Oral)  Resp 20  SpO2 100%  LMP 06/28/2013 Physical Exam  Nursing note and vitals reviewed. Constitutional: She is oriented to person, place, and time. She appears well-developed and well-nourished.  HENT:  Head: Normocephalic.  Eyes: EOM are normal. Pupils are  equal, round, and reactive to light.  Neck: Normal range of motion.  Pulmonary/Chest: Effort normal.  Abdominal: She exhibits no distension.  Musculoskeletal: Normal range of motion.  Neurological: She is alert and oriented to person, place, and time.  Psychiatric: She has a normal mood and affect.    ED Course  Procedures (including critical care time) Labs Review Labs Reviewed  POCT URINALYSIS DIP (DEVICE)   Imaging Review No results found.   MDM   1. Pregnancy      Positive pregnancy    Elson AreasLeslie K Sofia, PA-C 07/31/13 [phone]

## 2013-07-31 NOTE — ED Provider Notes (Signed)
Medical screening examination/treatment/procedure(s) were performed by non-physician practitioner and as supervising physician I was immediately available for consultation/collaboration.  Ajax Schroll, M.D.  Fayetta Sorenson C Temeka Pore, MD 07/31/13 2220 

## 2013-08-03 ENCOUNTER — Telehealth: Payer: Self-pay

## 2013-08-03 NOTE — Telephone Encounter (Addendum)
Unable to reach patient or leave a voice message.  Voice mailbox not set up.    Pap- 04/18/12-normal; currently pregnant Flu Td

## 2013-08-05 ENCOUNTER — Inpatient Hospital Stay (HOSPITAL_COMMUNITY)
Admission: AD | Admit: 2013-08-05 | Discharge: 2013-08-05 | Disposition: A | Discharge status: Home / Self Care | Payer: BC Managed Care – PPO | Source: Ambulatory Visit | Attending: Family Medicine | Admitting: Family Medicine

## 2013-08-05 ENCOUNTER — Inpatient Hospital Stay (HOSPITAL_COMMUNITY): Payer: BC Managed Care – PPO

## 2013-08-05 ENCOUNTER — Encounter (HOSPITAL_COMMUNITY): Payer: Self-pay

## 2013-08-05 DIAGNOSIS — R1032 Left lower quadrant pain: Secondary | ICD-10-CM | POA: Insufficient documentation

## 2013-08-05 DIAGNOSIS — O9989 Other specified diseases and conditions complicating pregnancy, childbirth and the puerperium: Principal | ICD-10-CM

## 2013-08-05 DIAGNOSIS — O99891 Other specified diseases and conditions complicating pregnancy: Secondary | ICD-10-CM | POA: Insufficient documentation

## 2013-08-05 DIAGNOSIS — R109 Unspecified abdominal pain: Secondary | ICD-10-CM

## 2013-08-05 DIAGNOSIS — O210 Mild hyperemesis gravidarum: Secondary | ICD-10-CM | POA: Insufficient documentation

## 2013-08-05 DIAGNOSIS — O26899 Other specified pregnancy related conditions, unspecified trimester: Secondary | ICD-10-CM

## 2013-08-05 LAB — URINALYSIS, ROUTINE W REFLEX MICROSCOPIC
Bilirubin Urine: NEGATIVE
Glucose, UA: NEGATIVE mg/dL
Hgb urine dipstick: NEGATIVE
KETONES UR: NEGATIVE mg/dL
LEUKOCYTES UA: NEGATIVE
Nitrite: NEGATIVE
PH: 6 (ref 5.0–8.0)
PROTEIN: NEGATIVE mg/dL
Specific Gravity, Urine: <1.005 — ABNORMAL LOW (ref 1.005–1.030)
Urobilinogen, UA: 0.2 mg/dL (ref 0.0–1.0)

## 2013-08-05 LAB — CBC
HEMATOCRIT: 36.7 % (ref 36.0–46.0)
Hemoglobin: 12.6 g/dL (ref 12.0–15.0)
MCH: 31.3 pg (ref 26.0–34.0)
MCHC: 34.3 g/dL (ref 30.0–36.0)
MCV: 91.3 fL (ref 78.0–100.0)
Platelets: 194 10*3/uL (ref 150–400)
RBC: 4.02 MIL/uL (ref 3.87–5.11)
RDW: 12 % (ref 11.5–15.5)
WBC: 6.9 10*3/uL (ref 4.0–10.5)

## 2013-08-05 LAB — ABO/RH: ABO/RH(D): B POS

## 2013-08-05 LAB — WET PREP, GENITAL
Trich, Wet Prep: NONE SEEN
Yeast Wet Prep HPF POC: NONE SEEN

## 2013-08-05 LAB — HCG, QUANTITATIVE, PREGNANCY: hCG, Beta Chain, Quant, S: 16184 m[IU]/mL — ABNORMAL HIGH (ref <5)

## 2013-08-05 MED ORDER — PROMETHAZINE HCL 25 MG PO TABS: 25.0000 mg | ORAL_TABLET | Freq: Four times a day (QID) | ORAL | Status: DC | PRN

## 2013-08-05 MED ORDER — OXYCODONE-ACETAMINOPHEN 5-325 MG PO TABS
1.0000 | ORAL_TABLET | Freq: Once | ORAL | Status: AC
  Administered 2013-08-05: 1 via ORAL
  Filled 2013-08-05: qty 1

## 2013-08-05 NOTE — MAU Provider Note (Signed)
History     CSN: [id number]  Arrival date and time: 08/05/13 1523   First Provider Initiated Contact with Patient 08/05/13 1647      Chief Complaint  Patient presents with  . Abdominal Pain   HPI Comments: Shelly Olsen 33 y.o. [id number] G2P0101 presents to MAU with left lower quad pain ongoing for 3 days. No bleeding. It is "6" on 1-10 scale. Some nausea with PNV only. Language barrier. Husband speaks English very well.   Abdominal Pain      Past Medical History  Diagnosis Date  . Thyroid disease   . Low iron     History reviewed. No pertinent past surgical history.  History reviewed. No pertinent family history.  History  Substance Use Topics  . Smoking status: Never Smoker   . Smokeless tobacco: Not on file  . Alcohol Use: Yes     Comment: occasional    Allergies: No Known Allergies  Prescriptions prior to admission  Medication Sig Dispense Refill  . prenatal vitamin w/FE, FA (NATACHEW) 29-1 MG CHEW chewable tablet Chew 1 tablet by mouth daily at 12 noon.  30 tablet  3    Review of Systems  Constitutional: Negative.   HENT: Negative.   Eyes: Negative.   Respiratory: Negative.   Cardiovascular: Negative.   Gastrointestinal: Positive for abdominal pain.  Genitourinary: Negative.   Musculoskeletal: Negative.   Skin: Negative.   Neurological: Negative.   Psychiatric/Behavioral: Negative.    Physical Exam   Blood pressure 103/60, pulse 80, temperature 98.8 F (37.1 C), temperature source Oral, resp. rate 16, height 4\' 11"  (1.499 m), weight 45.983 kg (101 lb 6 oz), last menstrual period 06/28/2013.  Physical Exam  Constitutional: She is oriented to person, place, and time. She appears well-developed and well-nourished. No distress.  HENT:  Head: Normocephalic and atraumatic.  Eyes: Pupils are equal, round, and reactive to light.  GI: Soft. Bowel sounds are normal. There is tenderness.  Genitourinary:  Genital:External negative Vaginal:small amount thin  white discharge Cervix:closed, thick, trickle of blood with testing only Bimanual: tenderness in left lower quad    Musculoskeletal: Normal range of motion.  Neurological: She is alert and oriented to person, place, and time.  Skin: Skin is warm.  Psychiatric: She has a normal mood and affect. Her behavior is normal. Judgment and thought content normal.   Results for orders placed during the hospital encounter of 08/05/13 (from the past 24 hour(s))  URINALYSIS, ROUTINE W REFLEX MICROSCOPIC     Status: Abnormal   Collection Time    08/05/13  3:48 PM      Result Value Ref Range   Color, Urine YELLOW  YELLOW   APPearance CLEAR  CLEAR   Specific Gravity, Urine <1.005 (*) 1.005 - 1.030   pH 6.0  5.0 - 8.0   Glucose, UA NEGATIVE  NEGATIVE mg/dL   Hgb urine dipstick NEGATIVE  NEGATIVE   Bilirubin Urine NEGATIVE  NEGATIVE   Ketones, ur NEGATIVE  NEGATIVE mg/dL   Protein, ur NEGATIVE  NEGATIVE mg/dL   Urobilinogen, UA 0.2  0.0 - 1.0 mg/dL   Nitrite NEGATIVE  NEGATIVE   Leukocytes, UA NEGATIVE  NEGATIVE  WET PREP, GENITAL     Status: Abnormal   Collection Time    08/05/13  5:10 PM      Result Value Ref Range   Yeast Wet Prep HPF POC NONE SEEN  NONE SEEN   Trich, Wet Prep NONE SEEN  NONE SEEN   Clue Cells  Wet Prep HPF POC FEW (*) NONE SEEN   WBC, Wet Prep HPF POC FEW (*) NONE SEEN  CBC     Status: None   Collection Time    08/05/13  5:39 PM      Result Value Ref Range   WBC 6.9  4.0 - 10.5 K/uL   RBC 4.02  3.87 - 5.11 MIL/uL   Hemoglobin 12.6  12.0 - 15.0 g/dL   HCT 54.0  98.1 - 19.1 %   MCV 91.3  78.0 - 100.0 fL   MCH 31.3  26.0 - 34.0 pg   MCHC 34.3  30.0 - 36.0 g/dL   RDW 47.8  29.5 - 62.1 %   Platelets 194  150 - 400 K/uL  HCG, QUANTITATIVE, PREGNANCY     Status: Abnormal   Collection Time    08/05/13  5:39 PM      Result Value Ref Range   hCG, Beta Chain, Quant, S 16184 (*) <5 mIU/mL  ABO/RH     Status: None   Collection Time    08/05/13  5:39 PM      Result Value Ref  Range   ABO/RH(D) B POS     .US Ob Comp Less 14 Wks  08/05/2013   CLINICAL DATA:  Left lower quadrant pain.  EXAM: OBSTETRIC <14 WK [place] AND TRANSVAGINAL OB US  TECHNIQUE: Both transabdominal and transvaginal ultrasound examinations were performed for complete evaluation of the gestation as well as the maternal uterus, adnexal regions, and pelvic cul-de-sac. Transvaginal technique was performed to assess early pregnancy.  COMPARISON:  None.  FINDINGS: Intrauterine gestational sac: Visualized/normal in shape.  Yolk sac:  Present.  Embryo:  Present.  Cardiac Activity: Present.  Heart Rate:  167 bpm  CRL:   0.24  mm   5 w 6 d                  [place] EDC:  Maternal uterus/adnexae: Uterus is retroverted. No other focal abnormality noted P  IMPRESSION: Single viable intrauterine pregnancy.   Electronically Signed   By: Maisie Fus  Register   On: 08/05/2013 18:22   US Ob Transvaginal  08/05/2013   CLINICAL DATA:  Left lower quadrant pain.  EXAM: OBSTETRIC <14 WK [place] AND TRANSVAGINAL OB US  TECHNIQUE: Both transabdominal and transvaginal ultrasound examinations were performed for complete evaluation of the gestation as well as the maternal uterus, adnexal regions, and pelvic cul-de-sac. Transvaginal technique was performed to assess early pregnancy.  COMPARISON:  None.  FINDINGS: Intrauterine gestational sac: Visualized/normal in shape.  Yolk sac:  Present.  Embryo:  Present.  Cardiac Activity: Present.  Heart Rate:  167 bpm  CRL:   0.24  mm   5 w 6 d                  [place] EDC:  Maternal uterus/adnexae: Uterus is retroverted. No other focal abnormality noted P  IMPRESSION: Single viable intrauterine pregnancy.   Electronically Signed   By: Maisie Fus  Register   On: 08/05/2013 18:22     MAU Course  Procedures  MDM Wet prep, GC, Chlamydia, CBC, UA, U/S, ABORh, Quant   Assessment and Plan   A: Pain in early pregnancy  P: Above orders Rest/ Fluids/ Tylenol Phenergan 25 mg 1/2 tab q hs prn nausea Pt plans care at [facility] Return to MAU as needed  Carolynn Serve 08/05/2013, 6:41 PM

## 2013-08-05 NOTE — MAU Note (Signed)
Pt states here for left sided lower abdominal pain for past 3 days. LMP-06/28/2013, [facility] Denies bleeding. Pain is intermittent, feels like a cramping/squeezing.

## 2013-08-05 NOTE — MAU Provider Note (Signed)
Attestation of Attending Supervision of Advanced Practitioner (PA/CNM/NP): Evaluation and management procedures were performed by the Advanced Practitioner under my supervision and collaboration.  I have reviewed the Advanced Practitioner's note and chart, and I agree with the management and plan.  Dontavia Brand S, MD [facility] Attending 08/05/2013 8:27 PM   

## 2013-08-05 NOTE — Discharge Instructions (Signed)
Abdominal Pain During Pregnancy °Abdominal pain is common in pregnancy. Most of the time, it does not cause harm. There are many causes of abdominal pain. Some causes are more serious than others. Some of the causes of abdominal pain in pregnancy are easily diagnosed. Occasionally, the diagnosis takes time to understand. Other times, the cause is not determined. Abdominal pain can be a sign that something is very wrong with the pregnancy, or the pain may have nothing to do with the pregnancy at all. For this reason, always tell your health care provider if you have any abdominal discomfort. °HOME CARE INSTRUCTIONS  °Monitor your abdominal pain for any changes. The following actions may help to alleviate any discomfort you are experiencing: °· Do not have sexual intercourse or put anything in your vagina until your symptoms go away completely. °· Get plenty of rest until your pain improves. °· Drink clear fluids if you feel nauseous. Avoid solid food as long as you are uncomfortable or nauseous. °· Only take over-the-counter or prescription medicine as directed by your health care provider. °· Keep all follow-up appointments with your health care provider. °SEEK IMMEDIATE MEDICAL CARE IF: °· You are bleeding, leaking fluid, or passing tissue from the vagina. °· You have increasing pain or cramping. °· You have persistent vomiting. °· You have painful or bloody urination. °· You have a fever. °· You notice a decrease in your baby's movements. °· You have extreme weakness or feel faint. °· You have shortness of breath, with or without abdominal pain. °· You develop a severe headache with abdominal pain. °· You have abnormal vaginal discharge with abdominal pain. °· You have persistent diarrhea. °· You have abdominal pain that continues even after rest, or gets worse. °MAKE SURE YOU:  °· Understand these instructions. °· Will watch your condition. °· Will get help right away if you are not doing well or get  worse. °Document Released: 04/20/2005 Document Revised: 02/08/2013 Document Reviewed: 11/17/2012 °[employer]® Patient Information ©2014 [employer]. ° °

## 2013-08-07 ENCOUNTER — Encounter: Payer: BC Managed Care – PPO | Admitting: Family Medicine

## 2013-08-07 LAB — GC/CHLAMYDIA PROBE AMP
CT Probe RNA: NEGATIVE
GC Probe RNA: NEGATIVE

## 2013-08-08 NOTE — Telephone Encounter (Signed)
Appointment cancelled

## 2013-08-10 ENCOUNTER — Other Ambulatory Visit: Payer: Self-pay | Admitting: Obstetrics and Gynecology

## 2013-09-20 LAB — OB RESULTS CONSOLE ANTIBODY SCREEN: Antibody Screen: NEGATIVE

## 2013-09-20 LAB — OB RESULTS CONSOLE GC/CHLAMYDIA
Chlamydia: NEGATIVE
GC PROBE AMP, GENITAL: NEGATIVE

## 2013-09-20 LAB — OB RESULTS CONSOLE RPR: RPR: NONREACTIVE

## 2013-09-20 LAB — OB RESULTS CONSOLE ABO/RH: RH Type: POSITIVE

## 2013-09-20 LAB — OB RESULTS CONSOLE HIV ANTIBODY (ROUTINE TESTING): HIV: NONREACTIVE

## 2013-09-20 LAB — OB RESULTS CONSOLE RUBELLA ANTIBODY, IGM: Rubella: IMMUNE

## 2013-09-20 LAB — OB RESULTS CONSOLE HEPATITIS B SURFACE ANTIGEN: Hepatitis B Surface Ag: NEGATIVE

## 2014-02-07 ENCOUNTER — Other Ambulatory Visit (HOSPITAL_COMMUNITY): Payer: Self-pay | Admitting: Obstetrics

## 2014-02-07 DIAGNOSIS — IMO0002 Reserved for concepts with insufficient information to code with codable children: Secondary | ICD-10-CM

## 2014-02-07 DIAGNOSIS — O09213 Supervision of pregnancy with history of pre-term labor, third trimester: Secondary | ICD-10-CM

## 2014-02-14 ENCOUNTER — Ambulatory Visit (HOSPITAL_COMMUNITY): Admission: RE | Admit: 2014-02-14 | Payer: BC Managed Care – PPO | Source: Ambulatory Visit

## 2014-02-14 ENCOUNTER — Encounter (HOSPITAL_COMMUNITY): Payer: Self-pay

## 2014-02-14 ENCOUNTER — Ambulatory Visit (HOSPITAL_COMMUNITY)
Admission: RE | Admit: 2014-02-14 | Discharge: 2014-02-14 | Disposition: A | Discharge status: Home / Self Care | Payer: BC Managed Care – PPO | Source: Ambulatory Visit | Attending: Obstetrics | Admitting: Obstetrics

## 2014-02-14 ENCOUNTER — Other Ambulatory Visit (HOSPITAL_COMMUNITY): Payer: Self-pay | Admitting: Obstetrics

## 2014-02-14 VITALS — BP 118/68 | HR 78 | Wt 122.2 lb

## 2014-02-14 DIAGNOSIS — Z3A33 33 weeks gestation of pregnancy: Secondary | ICD-10-CM

## 2014-02-14 DIAGNOSIS — O09213 Supervision of pregnancy with history of pre-term labor, third trimester: Secondary | ICD-10-CM | POA: Diagnosis not present

## 2014-02-14 DIAGNOSIS — IMO0002 Reserved for concepts with insufficient information to code with codable children: Secondary | ICD-10-CM

## 2014-02-14 DIAGNOSIS — O365930 Maternal care for other known or suspected poor fetal growth, third trimester, not applicable or unspecified: Secondary | ICD-10-CM | POA: Diagnosis present

## 2014-02-26 ENCOUNTER — Other Ambulatory Visit (HOSPITAL_COMMUNITY): Payer: Self-pay | Admitting: Obstetrics and Gynecology

## 2014-02-26 DIAGNOSIS — O365999 Maternal care for other known or suspected poor fetal growth, unspecified trimester, other fetus: Secondary | ICD-10-CM

## 2014-02-27 ENCOUNTER — Ambulatory Visit (HOSPITAL_COMMUNITY)
Admission: RE | Admit: 2014-02-27 | Discharge: 2014-02-27 | Disposition: A | Discharge status: Home / Self Care | Payer: BC Managed Care – PPO | Source: Ambulatory Visit | Attending: Obstetrics and Gynecology | Admitting: Obstetrics and Gynecology

## 2014-02-27 DIAGNOSIS — O365930 Maternal care for other known or suspected poor fetal growth, third trimester, not applicable or unspecified: Secondary | ICD-10-CM | POA: Insufficient documentation

## 2014-02-27 DIAGNOSIS — O365999 Maternal care for other known or suspected poor fetal growth, unspecified trimester, other fetus: Secondary | ICD-10-CM

## 2014-02-27 DIAGNOSIS — Z3A34 34 weeks gestation of pregnancy: Secondary | ICD-10-CM | POA: Insufficient documentation

## 2014-02-27 DIAGNOSIS — IMO0002 Reserved for concepts with insufficient information to code with codable children: Secondary | ICD-10-CM

## 2014-03-02 ENCOUNTER — Other Ambulatory Visit: Payer: Self-pay

## 2014-03-02 LAB — OB RESULTS CONSOLE GBS: STREP GROUP B AG: NEGATIVE

## 2014-03-05 ENCOUNTER — Encounter (HOSPITAL_COMMUNITY): Payer: Self-pay

## 2014-03-08 ENCOUNTER — Telehealth (HOSPITAL_COMMUNITY): Payer: Self-pay | Admitting: *Deleted

## 2014-03-08 ENCOUNTER — Encounter (HOSPITAL_COMMUNITY): Payer: Self-pay | Admitting: *Deleted

## 2014-03-08 NOTE — Telephone Encounter (Signed)
Preadmission screen  

## 2014-03-14 ENCOUNTER — Encounter (HOSPITAL_COMMUNITY): Payer: Self-pay

## 2014-03-14 ENCOUNTER — Inpatient Hospital Stay (HOSPITAL_COMMUNITY)
Admission: RE | Admit: 2014-03-14 | Discharge: 2014-03-16 | DRG: 775 | Disposition: A | Discharge status: Home / Self Care | Payer: BC Managed Care – PPO | Source: Ambulatory Visit | Attending: Obstetrics and Gynecology | Admitting: Obstetrics and Gynecology

## 2014-03-14 DIAGNOSIS — Z3A37 37 weeks gestation of pregnancy: Secondary | ICD-10-CM | POA: Diagnosis present

## 2014-03-14 DIAGNOSIS — O365990 Maternal care for other known or suspected poor fetal growth, unspecified trimester, not applicable or unspecified: Secondary | ICD-10-CM | POA: Diagnosis present

## 2014-03-14 DIAGNOSIS — O365930 Maternal care for other known or suspected poor fetal growth, third trimester, not applicable or unspecified: Principal | ICD-10-CM | POA: Diagnosis present

## 2014-03-14 LAB — CBC
HCT: 36.6 % (ref 36.0–46.0)
HEMOGLOBIN: 12.4 g/dL (ref 12.0–15.0)
MCH: 31.7 pg (ref 26.0–34.0)
MCHC: 33.9 g/dL (ref 30.0–36.0)
MCV: 93.6 fL (ref 78.0–100.0)
PLATELETS: 219 10*3/uL (ref 150–400)
RBC: 3.91 MIL/uL (ref 3.87–5.11)
RDW: 12.6 % (ref 11.5–15.5)
WBC: 9.4 10*3/uL (ref 4.0–10.5)

## 2014-03-14 LAB — RPR

## 2014-03-14 MED ORDER — CITRIC ACID-SODIUM CITRATE 334-500 MG/5ML PO SOLN
30.0000 mL | ORAL | Status: DC | PRN
Start: 2014-03-14 — End: 2014-03-14

## 2014-03-14 MED ORDER — BENZOCAINE-MENTHOL 20-0.5 % EX AERO: 1.0000 "application " | INHALATION_SPRAY | CUTANEOUS | Status: DC | PRN

## 2014-03-14 MED ORDER — OXYCODONE-ACETAMINOPHEN 5-325 MG PO TABS
1.0000 | ORAL_TABLET | ORAL | Status: DC | PRN
  Administered 2014-03-14 – 2014-03-15 (×2): 1 via ORAL
  Filled 2014-03-14 (×2): qty 1

## 2014-03-14 MED ORDER — PHENYLEPHRINE 40 MCG/ML (10ML) SYRINGE FOR IV PUSH (FOR BLOOD PRESSURE SUPPORT)
80.0000 ug | PREFILLED_SYRINGE | INTRAVENOUS | Status: DC | PRN
Start: 2014-03-14 — End: 2014-03-14
  Filled 2014-03-14: qty 2

## 2014-03-14 MED ORDER — IBUPROFEN 600 MG PO TABS
600.0000 mg | ORAL_TABLET | Freq: Four times a day (QID) | ORAL | Status: DC
  Administered 2014-03-14 – 2014-03-16 (×8): 600 mg via ORAL
  Filled 2014-03-14 (×8): qty 1

## 2014-03-14 MED ORDER — WITCH HAZEL-GLYCERIN EX PADS: 1.0000 "application " | MEDICATED_PAD | CUTANEOUS | Status: DC | PRN

## 2014-03-14 MED ORDER — FENTANYL 2.5 MCG/ML BUPIVACAINE 1/10 % EPIDURAL INFUSION (WH - ANES): 14.0000 mL/h | INTRAMUSCULAR | Status: DC | PRN

## 2014-03-14 MED ORDER — OXYCODONE-ACETAMINOPHEN 5-325 MG PO TABS: 2.0000 | ORAL_TABLET | ORAL | Status: DC | PRN

## 2014-03-14 MED ORDER — TETANUS-DIPHTH-ACELL PERTUSSIS 5-2.5-18.5 LF-MCG/0.5 IM SUSP
0.5000 mL | Freq: Once | INTRAMUSCULAR | Status: AC
  Administered 2014-03-15: 0.5 mL via INTRAMUSCULAR
  Filled 2014-03-14: qty 0.5

## 2014-03-14 MED ORDER — ONDANSETRON HCL 4 MG/2ML IJ SOLN: 4.0000 mg | INTRAMUSCULAR | Status: DC | PRN

## 2014-03-14 MED ORDER — DIPHENHYDRAMINE HCL 25 MG PO CAPS: 25.0000 mg | ORAL_CAPSULE | Freq: Four times a day (QID) | ORAL | Status: DC | PRN

## 2014-03-14 MED ORDER — DIPHENHYDRAMINE HCL 50 MG/ML IJ SOLN: 12.5000 mg | INTRAMUSCULAR | Status: DC | PRN

## 2014-03-14 MED ORDER — ONDANSETRON HCL 4 MG PO TABS: 4.0000 mg | ORAL_TABLET | ORAL | Status: DC | PRN

## 2014-03-14 MED ORDER — EPHEDRINE 5 MG/ML INJ
10.0000 mg | INTRAVENOUS | Status: DC | PRN
  Filled 2014-03-14: qty 2

## 2014-03-14 MED ORDER — LANOLIN HYDROUS EX OINT: TOPICAL_OINTMENT | CUTANEOUS | Status: DC | PRN

## 2014-03-14 MED ORDER — LIDOCAINE HCL (PF) 1 % IJ SOLN
30.0000 mL | INTRAMUSCULAR | Status: DC | PRN
  Filled 2014-03-14: qty 30

## 2014-03-14 MED ORDER — TERBUTALINE SULFATE 1 MG/ML IJ SOLN
0.2500 mg | Freq: Once | INTRAMUSCULAR | Status: DC | PRN
Start: 2014-03-14 — End: 2014-03-14

## 2014-03-14 MED ORDER — OXYTOCIN 40 UNITS IN LACTATED RINGERS INFUSION - SIMPLE MED
62.5000 mL/h | INTRAVENOUS | Status: DC
  Administered 2014-03-14: 62.5 mL/h via INTRAVENOUS
  Filled 2014-03-14: qty 1000

## 2014-03-14 MED ORDER — SIMETHICONE 80 MG PO CHEW: 80.0000 mg | CHEWABLE_TABLET | ORAL | Status: DC | PRN

## 2014-03-14 MED ORDER — OXYCODONE-ACETAMINOPHEN 5-325 MG PO TABS: 1.0000 | ORAL_TABLET | ORAL | Status: DC | PRN

## 2014-03-14 MED ORDER — ZOLPIDEM TARTRATE 5 MG PO TABS: 5.0000 mg | ORAL_TABLET | Freq: Every evening | ORAL | Status: DC | PRN

## 2014-03-14 MED ORDER — LACTATED RINGERS IV SOLN: 500.0000 mL | INTRAVENOUS | Status: DC | PRN

## 2014-03-14 MED ORDER — PHENYLEPHRINE 40 MCG/ML (10ML) SYRINGE FOR IV PUSH (FOR BLOOD PRESSURE SUPPORT)
80.0000 ug | PREFILLED_SYRINGE | INTRAVENOUS | Status: DC | PRN
  Filled 2014-03-14: qty 2

## 2014-03-14 MED ORDER — ONDANSETRON HCL 4 MG/2ML IJ SOLN: 4.0000 mg | Freq: Four times a day (QID) | INTRAMUSCULAR | Status: DC | PRN

## 2014-03-14 MED ORDER — OXYTOCIN 40 UNITS IN LACTATED RINGERS INFUSION - SIMPLE MED
1.0000 m[IU]/min | INTRAVENOUS | Status: DC
  Administered 2014-03-14: 2 m[IU]/min via INTRAVENOUS

## 2014-03-14 MED ORDER — ACETAMINOPHEN 325 MG PO TABS: 650.0000 mg | ORAL_TABLET | ORAL | Status: DC | PRN

## 2014-03-14 MED ORDER — PRENATAL MULTIVITAMIN CH
1.0000 | ORAL_TABLET | Freq: Every day | ORAL | Status: DC
  Administered 2014-03-15 – 2014-03-16 (×2): 1 via ORAL
  Filled 2014-03-14 (×2): qty 1

## 2014-03-14 MED ORDER — LACTATED RINGERS IV SOLN
INTRAVENOUS | Status: DC
  Administered 2014-03-14: 125 mL/h via INTRAVENOUS

## 2014-03-14 MED ORDER — SENNOSIDES-DOCUSATE SODIUM 8.6-50 MG PO TABS
2.0000 | ORAL_TABLET | ORAL | Status: DC
  Administered 2014-03-16: 2 via ORAL
  Filled 2014-03-14: qty 2

## 2014-03-14 MED ORDER — OXYTOCIN BOLUS FROM INFUSION: 500.0000 mL | INTRAVENOUS | Status: DC

## 2014-03-14 MED ORDER — LACTATED RINGERS IV SOLN
500.0000 mL | Freq: Once | INTRAVENOUS | Status: DC
Start: 2014-03-14 — End: 2014-03-14

## 2014-03-14 MED ORDER — DIBUCAINE 1 % RE OINT: 1.0000 "application " | TOPICAL_OINTMENT | RECTAL | Status: DC | PRN

## 2014-03-14 NOTE — H&P (Signed)
33 y.o. 3054w0d  G2P0101 comes in for scheduled IOL d/t IUGR diagnosed approximately 32weeks  She has been receiving biweekly testing which includes weekly doppler and AFI which have been normal.  She had a prior pregnancy affected by IUGR and delivered at 32 weeks.  As far as we know this was determined to be an SGA baby.  Otherwise has good fetal movement and no bleeding.  Past Medical History  Diagnosis Date  . Thyroid disease   . Low iron    No past surgical history on file.  OB History  Gravida Para Term Preterm AB SAB TAB Ectopic Multiple Living  2 1 0 1 0 0 0 0 0 1     # Outcome Date GA Lbr Len/2nd Weight Sex Delivery Anes PTL Lv  2 Current           1 Preterm 08/31/05    M Vag-Spont None  Y      History   Social History  . Marital Status: Married    Spouse Name: N/A    Number of Children: N/A  . Years of Education: N/A   Occupational History  . Not on file.   Social History Main Topics  . Smoking status: Never Smoker   . Smokeless tobacco: Not on file  . Alcohol Use: Yes     Comment: occasional  . Drug Use: No  . Sexual Activity: Yes   Other Topics Concern  . Not on file   Social History Narrative   Review of patient's allergies indicates no known allergies.    Prenatal Transfer Tool  Maternal Diabetes: No Genetic Screening: Normal Maternal Ultrasounds/Referrals: Normal anatomy scan Fetal Ultrasounds or other Referrals:  Referred to Materal Fetal Medicine for IUGR Maternal Substance Abuse:  No Significant Maternal Medications:  None Significant Maternal Lab Results: Lab values include: Group B Strep negative  Other PNC: uncomplicated.    There were no vitals filed for this visit.   Lungs/Cor:  NAD Abdomen:  soft, gravid Ex:  no cords, erythema SVE:  pending FHTs:  pending Toco:  pending   A/P   Admit to L&D for IOL d/t IUGR  GBS Neg  Routine care  Cytotec vs foley/ pitocin after cervical exam determination  Tyreese Thain

## 2014-03-14 NOTE — Progress Notes (Signed)
Foley bulb placed at admission with cervix loose 2 cm, pitocin started FHT reassuring, Cat 1, foley now out and 5/80/-2 Will consider AROM when head applied Epidural when desired. Continue other routine care

## 2014-03-15 ENCOUNTER — Other Ambulatory Visit (HOSPITAL_COMMUNITY): Payer: Self-pay

## 2014-03-15 LAB — CBC
HEMATOCRIT: 32.4 % — AB (ref 36.0–46.0)
HEMOGLOBIN: 11 g/dL — AB (ref 12.0–15.0)
MCH: 31.5 pg (ref 26.0–34.0)
MCHC: 34 g/dL (ref 30.0–36.0)
MCV: 92.8 fL (ref 78.0–100.0)
Platelets: 173 10*3/uL (ref 150–400)
RBC: 3.49 MIL/uL — ABNORMAL LOW (ref 3.87–5.11)
RDW: 12.6 % (ref 11.5–15.5)
WBC: 13.2 10*3/uL — AB (ref 4.0–10.5)

## 2014-03-15 MED ORDER — INFLUENZA VAC SPLIT QUAD 0.5 ML IM SUSY
0.5000 mL | PREFILLED_SYRINGE | INTRAMUSCULAR | Status: DC
Start: 2014-03-16 — End: 2014-03-15

## 2014-03-15 NOTE — Plan of Care (Signed)
Problem: Phase II Progression Outcomes Goal: Pain controlled on oral analgesia Outcome: Completed/Met Date Met:  03/15/14 Goal: Progress activity as tolerated unless otherwise ordered Outcome: Completed/Met Date Met:  03/15/14 Goal: Afebrile, VS remain stable Outcome: Completed/Met Date Met:  03/15/14 Goal: Incision intact & without signs/symptoms of infection Outcome: Not Applicable Date Met:  07/86/75 Goal: Tolerating diet Outcome: Completed/Met Date Met:  03/15/14 Goal: Other Phase II Outcomes/Goals Outcome: Not Applicable Date Met:  44/92/01

## 2014-03-15 NOTE — Progress Notes (Signed)
Patient is eating, ambulating, voiding.  Pain control is good.  Filed Vitals:   03/14/14 1800 03/14/14 2200 03/15/14 0004 03/15/14 0559  BP: 109/55 107/56 112/58 115/58  Pulse: 74 72 68 68  Temp: 98.2 F (36.8 C) 98.2 F (36.8 C) 98.2 F (36.8 C) 98.2 F (36.8 C)  TempSrc: Oral Oral    Resp: 20 20 18 18   Height:      Weight:      SpO2:   100%     Fundus firm Perineum without swelling.  Lab Results  Component Value Date   WBC 13.2* 03/15/2014   HGB 11.0* 03/15/2014   HCT 32.4* 03/15/2014   MCV 92.8 03/15/2014   PLT 173 03/15/2014    B/Positive/-- (05/20 0000)/RI  A/P Post partum day 1.  Routine care.  Expect d/c routine.    Sorayah Schrodt A

## 2014-03-15 NOTE — Lactation Note (Addendum)
This note was copied from the chart of Shelly Mardene Simone. Lactation Consultation Note With camb[place]ian interpreter [employer] Interpreter Line, reviewed LPI sheet. Feeding position options, feeding times, I&O documentation, hand expression, and post-pumping reviewed w/interpreter. DEBP set up and demonstrated, w/cleaning. Mom states that FOB speaks and reads good english but wasn't much help to me, so I called language line. Mom encouraged to feed baby 8-12 times/24 hours and with feeding cues. Mom encouraged to waken baby for feeds. WH/LC brochure given w/resources, support groups and LC services. Educated about newborn behavior. Encouraged to call for assistance if needed and to verify proper latch. Mom encouraged to do skin-to-skin.Referred to Baby and Me Book in Breastfeeding section Pg. 22-23 for position options and Proper latch demonstration.Mom knows to pump q3h for 15-20 min. Patient Name: Shelly Olsen VHQIO'NToday's Date: 03/15/2014 Reason for consult: Initial assessment   Maternal Data Has patient been taught Hand Expression?: Yes Does the patient have breastfeeding experience prior to this delivery?: Yes  Feeding Feeding Type: Breast Fed Length of feed: 15 min  LATCH Score/Interventions Latch: Grasps breast easily, tongue down, lips flanged, rhythmical sucking.  Audible Swallowing: A few with stimulation  Type of Nipple: Everted at rest and after stimulation  Comfort (Breast/Nipple): Soft / non-tender     Hold (Positioning): Assistance needed to correctly position infant at breast and maintain latch. Intervention(s): Breastfeeding basics reviewed;Support Pillows;Position options;Skin to skin  LATCH Score: 8  Lactation Tools Discussed/Used Tools: Pump Breast pump type: Double-Electric Breast Pump Pump Review: Setup, frequency, and cleaning;Milk Storage Initiated by:: Peri JeffersonL. Andilyn Bettcher RN Date initiated:: 03/15/14   Consult Status Consult Status: Follow-up Date:  03/15/14 Follow-up type: In-patient    Tawnya Pujol, Diamond NickelLAURA G 03/15/2014, 4:12 AM

## 2014-03-15 NOTE — Plan of Care (Signed)
Problem: Phase I Progression Outcomes Goal: Pain controlled with appropriate interventions Outcome: Completed/Met Date Met:  03/15/14 Goal: Voiding adequately Outcome: Completed/Met Date Met:  03/15/14 Goal: OOB as tolerated unless otherwise ordered Outcome: Completed/Met Date Met:  03/15/14 Goal: Other Phase I Outcomes/Goals Outcome: Not Applicable Date Met:  19/37/90

## 2014-03-15 NOTE — Plan of Care (Signed)
Problem: Phase I Progression Outcomes Goal: VS, stable, temp < 100.4 degrees F Outcome: Completed/Met Date Met:  03/15/14 Goal: Initial discharge plan identified Outcome: Completed/Met Date Met:  03/15/14

## 2014-03-15 NOTE — Lactation Note (Signed)
This note was copied from the chart of Shelly Tarren Fitzwater. Lactation Consultation Note Discussed with [facility]BU RN Clydie BraunKaren to encourage mom to supplement with all feedings due to 5# and 37w gestation.  RN reports mom did supplement with 10 mls of colostrum this evening and she will encourage mom to continue.   Patient Name: Shelly Olsen VWUJW'JToday's Date: 03/15/2014     Maternal Data    Feeding Feeding Type: Breast Milk Length of feed: 25 min  LATCH Score/Interventions Latch: Grasps breast easily, tongue down, lips flanged, rhythmical sucking.  Audible Swallowing: A few with stimulation Intervention(s): Skin to skin;Hand expression  Type of Nipple: Everted at rest and after stimulation  Comfort (Breast/Nipple): Soft / non-tender     Hold (Positioning): Assistance needed to correctly position infant at breast and maintain latch. Intervention(s): Breastfeeding basics reviewed;Support Pillows;Position options;Skin to skin  LATCH Score: 8  Lactation Tools Discussed/Used     Consult Status      Tracey Stewart, Arvella MerlesJana Lynn 03/15/2014, 10:22 PM

## 2014-03-16 ENCOUNTER — Ambulatory Visit: Payer: Self-pay

## 2014-03-16 MED ORDER — IBUPROFEN 600 MG PO TABS: 600.0000 mg | ORAL_TABLET | Freq: Four times a day (QID) | ORAL | Status: AC | PRN

## 2014-03-16 NOTE — Plan of Care (Signed)
Problem: Consults Goal: Postpartum Patient Education (See Patient Education module for education specifics.)  Outcome: Progressing  Problem: Discharge Progression Outcomes Goal: Barriers To Progression Addressed/Resolved Outcome: [place] Goal: Tolerating diet Outcome: Progressing Goal: Complications resolved/controlled Outcome: Progressing Goal: Pain controlled with appropriate interventions Outcome: Not Applicable Date Met:  88/41/66

## 2014-03-16 NOTE — Progress Notes (Signed)
Patient is eating, ambulating, voiding.  Pain control is good.  Lochia minimal  Filed Vitals:   03/15/14 0004 03/15/14 0559 03/15/14 1753 03/16/14 0555  BP: 112/58 115/58 95/55 98/52   Pulse: 68 68 62 75  Temp: 98.2 F (36.8 C) 98.2 F (36.8 C) 98.8 F (37.1 C) 98.2 F (36.8 C)  TempSrc:   Oral Oral  Resp: 18 18 18 16   Height:      Weight:      SpO2: 100%      NAD Fundus firm Ext tr edema  Lab Results  Component Value Date   WBC 13.2* 03/15/2014   HGB 11.0* 03/15/2014   HCT 32.4* 03/15/2014   MCV 92.8 03/15/2014   PLT 173 03/15/2014    B/Positive/-- (05/20 0000)/RI  A/P Post partum day 2  Meeting all goals.  D/c today.  Mom to room in w baby (low birth weight, to stay additional day)   Green LevelLARK, Valley[facility] Medical CenterDYANNA

## 2014-03-16 NOTE — Discharge Instructions (Signed)

## 2014-03-16 NOTE — Discharge Summary (Signed)
Obstetric Discharge Summary Reason for Admission: induction of labor IUGR Prenatal Procedures: ultrasound Intrapartum Procedures: spontaneous vaginal delivery Postpartum Procedures: none Complications-Operative and Postpartum: none HEMOGLOBIN  Date Value Ref Range Status  03/15/2014 11.0* 12.0 - 15.0 g/dL Final  11/91/478208/11/2012 95.614.2 12.2 - 16.2 g/dL Final   HCT  Date Value Ref Range Status  03/15/2014 32.4* 36.0 - 46.0 % Final   HCT, POC  Date Value Ref Range Status  12/08/2012 44.8 37.7 - 47.9 % Final    Physical Exam:  General: alert, cooperative and appears stated age 46Lochia: appropriate Uterine Fundus: firm DVT Evaluation: No evidence of DVT seen on physical exam.  Discharge Diagnoses: Term Pregnancy-delivered  Discharge Information: Date: 03/16/2014 Activity: pelvic rest Diet: routine Medications: PNV and Ibuprofen Condition: stable Instructions: refer to practice specific booklet Discharge to: home Follow-up Information    Follow up with CALLAHAN, SIDNEY, DO. Schedule an appointment as soon as possible for a visit in 4 weeks.   Specialty:  Obstetrics and Gynecology   Contact information:   [address] [place] [id number] [phone]       Newborn Data: Live born female  Birth Weight: 5 lb 3.4 oz (2365 g) APGAR: 9, 9  Baby staying an extra day given SGA  Marlow BaarsCLARK, Cedar Roseman 03/16/2014, 10:14 AM

## 2014-03-16 NOTE — Lactation Note (Signed)
This note was copied from the chart of Shelly Siren Hakimian. Lactation Consultation Note  Mom states baby just finished nursing for 15 minutes.  Baby sleepy.  Mom was able to verbalize the feeding plan and understands the importance of post pumping and supplementing.  Breasts are full and mom is able to obtain 15 mls from each breast.  Instructed to call for assist/concerns prn.  Patient Name: Shelly Olsen WUJWJ'XToday's Date: 03/16/2014     Maternal Data    Feeding Feeding Type: Breast Fed Length of feed: 30 min  LATCH Score/Interventions Latch: Grasps breast easily, tongue down, lips flanged, rhythmical sucking.  Audible Swallowing: A few with stimulation  Type of Nipple: Everted at rest and after stimulation  Comfort (Breast/Nipple): Soft / non-tender     Hold (Positioning): No assistance needed to correctly position infant at breast.  LATCH Score: 9  Lactation Tools Discussed/Used     Consult Status      [name], Steward Sames S 03/16/2014, 2:49 PM

## 2014-03-17 ENCOUNTER — Ambulatory Visit: Payer: Self-pay

## 2014-03-17 NOTE — Lactation Note (Signed)
This note was copied from the chart of Shelly Olsen. Lactation Consultation Note:  Mother is breastfeeding and post pumping. She pumped and bottle fed 35ml of colostrum. Mothers breast are firm and filling. Reviewed treatment to prevent severe engorgement. Mother is active with WIC. [facility]. [facility] Advised mother to continue to breastfeed infant well , then supplement and post pump. Mother denies having any nipple tenderness and states she is hearing infant swallow well. Review of collection and milk storage guidelines. Mother receptive to all teaching. [facility]. Mother has an appt on Monday for weight check.   Patient Name: Shelly Kassie MendsRachana Bourassa ZOXWR'UToday's Date: 03/17/2014     Maternal Data    Feeding Feeding Type: Bottle Fed - Breast Milk  LATCH Score/Interventions                      Lactation Tools Discussed/Used     Consult Status      Michel BickersKendrick, Jaevin Medearis McCoy 03/17/2014, 3:13 PM

## 2014-06-25 IMAGING — US US OB TRANSVAGINAL
1 series · 14 of 28 positions shown · non-contrast
Comparison: None.

CLINICAL DATA: Left lower quadrant pain.

EXAM:
OBSTETRIC <14 WK US AND TRANSVAGINAL OB US
TECHNIQUE: Both transabdominal and transvaginal ultrasound examinations were
performed for complete evaluation of the gestation as well as the
maternal uterus, adnexal regions, and pelvic cul-de-sac.
Transvaginal technique was performed to assess early pregnancy.

[Series 1: us ob comp less 14 wks · 35 acquisitions, 14 frames shown]
[im 2/35]
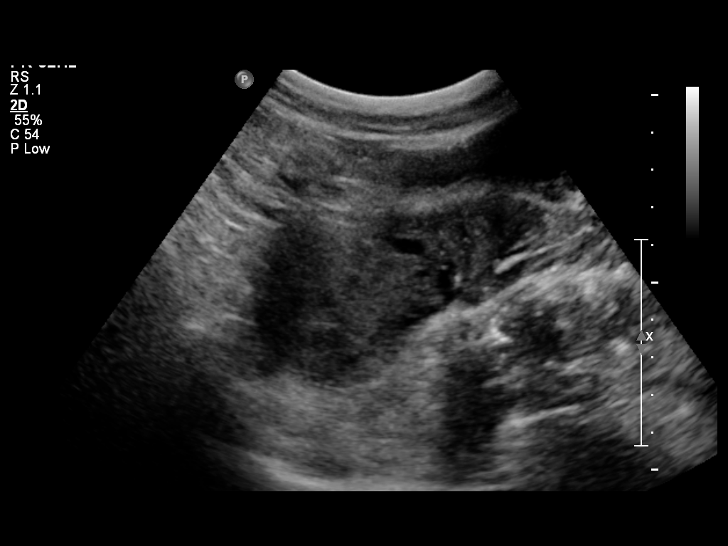
[im 4/35]
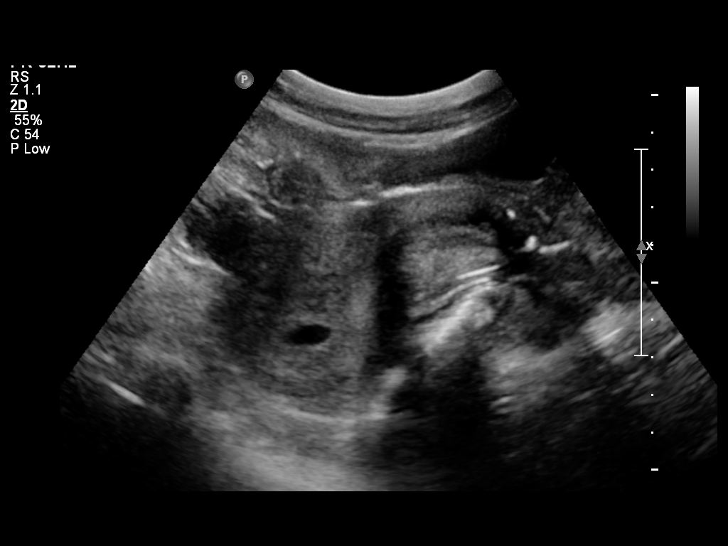
[im 7/35]
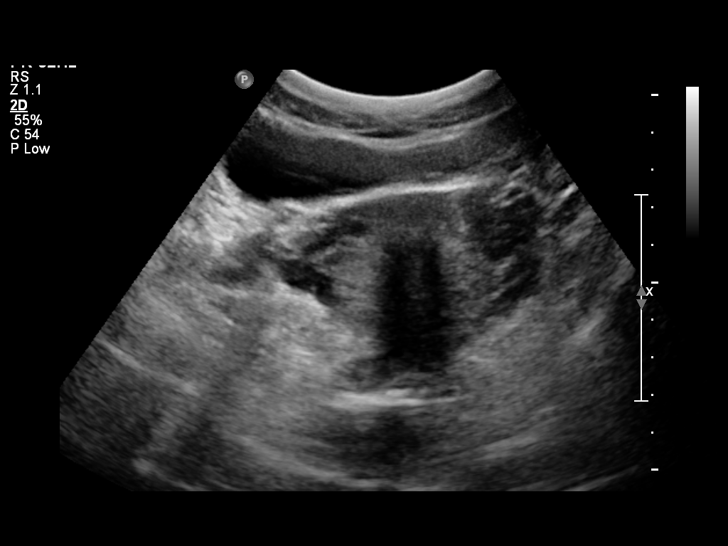
[im 9/35]
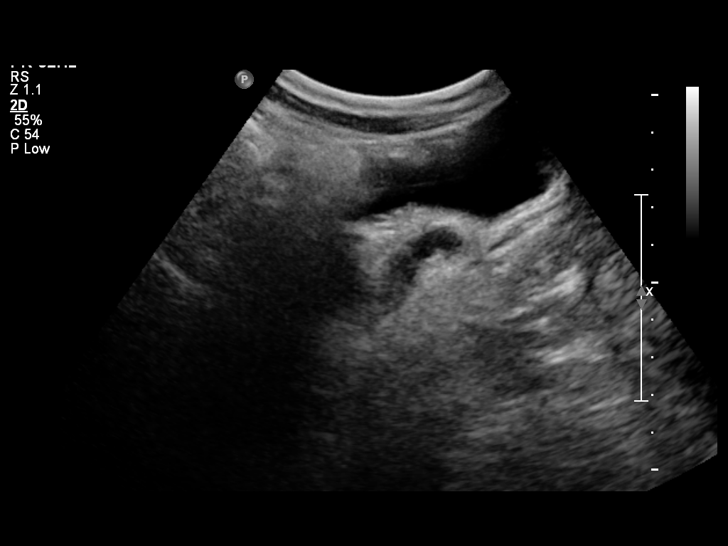
[im 12/35]
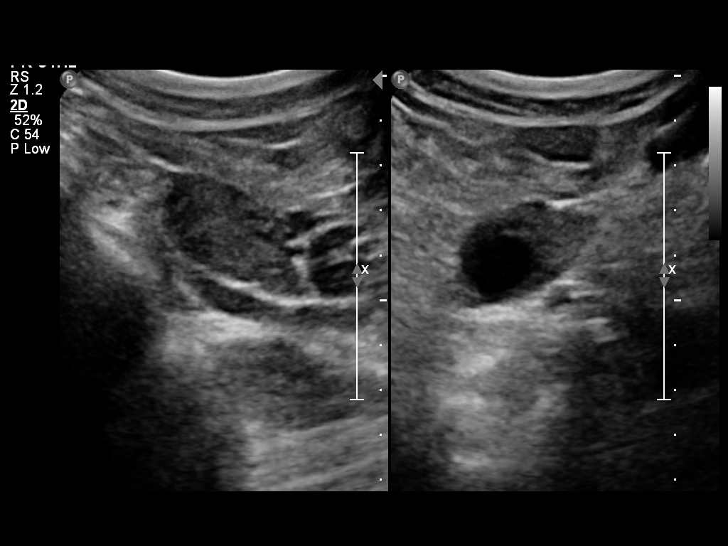
[im 14/35]
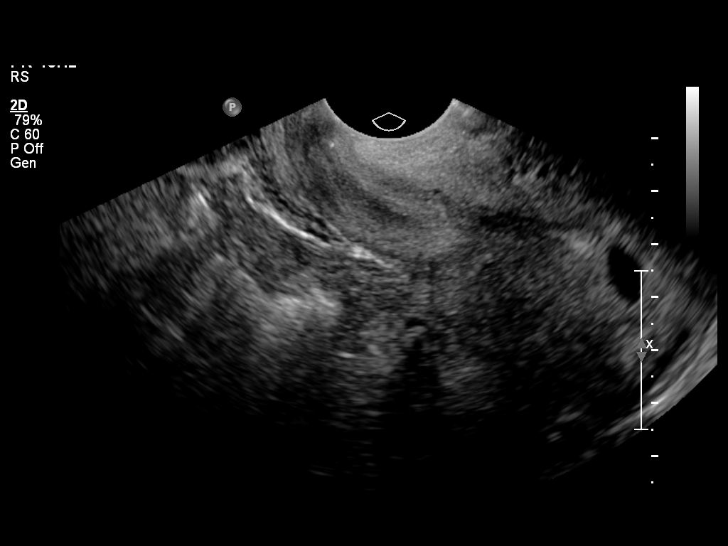
[im 17/35]
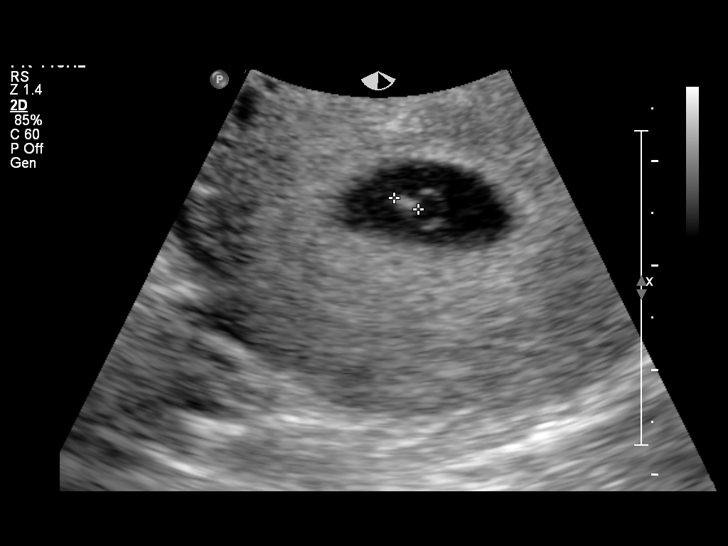
[im 19/35]
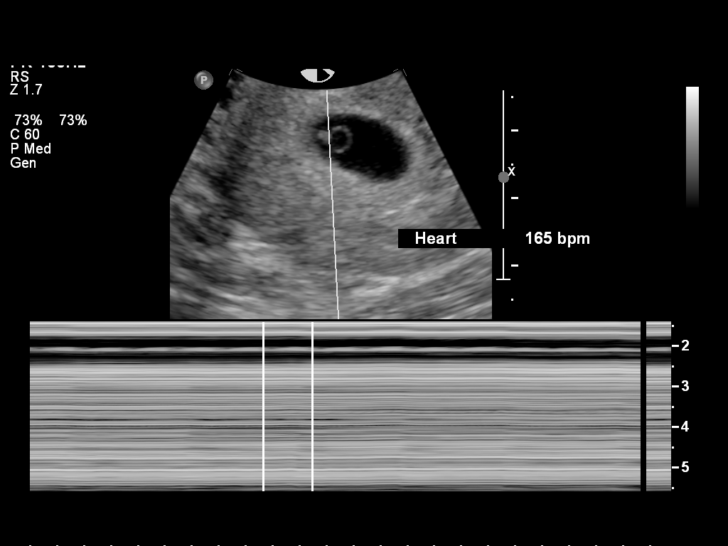
[im 22/35]
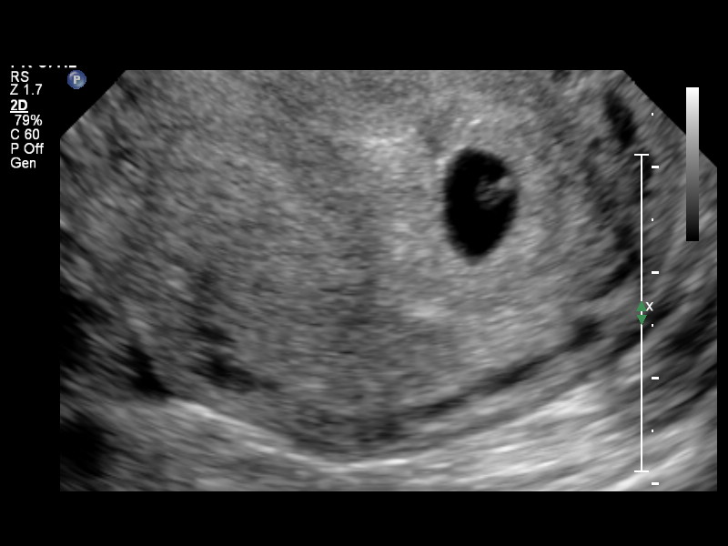
[im 24/35]
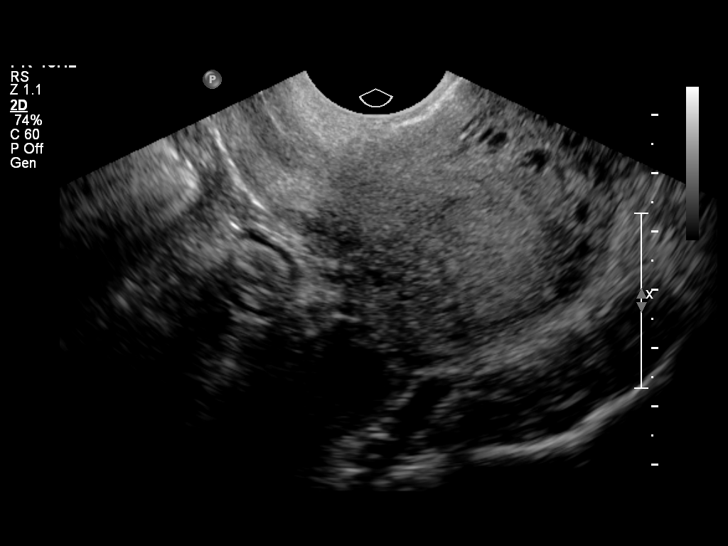
[im 27/35]
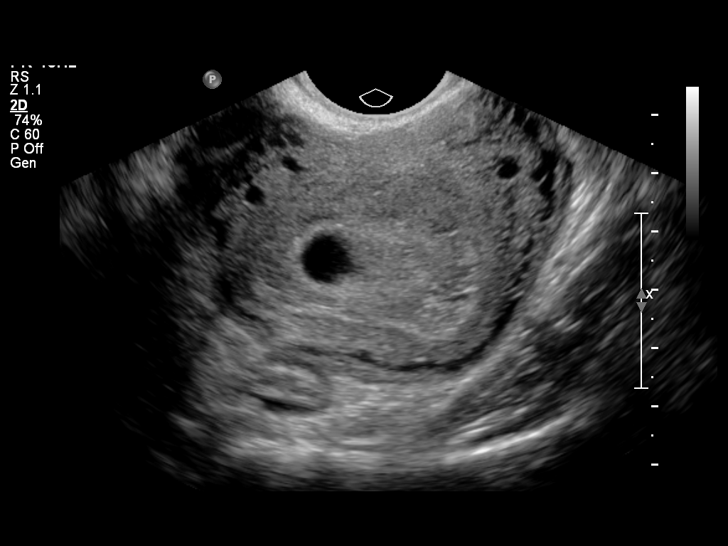
[im 29/35]
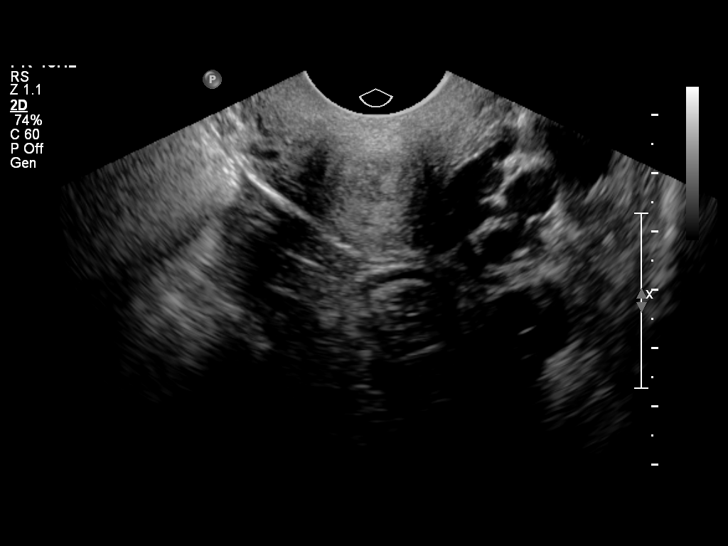
[im 32/35]
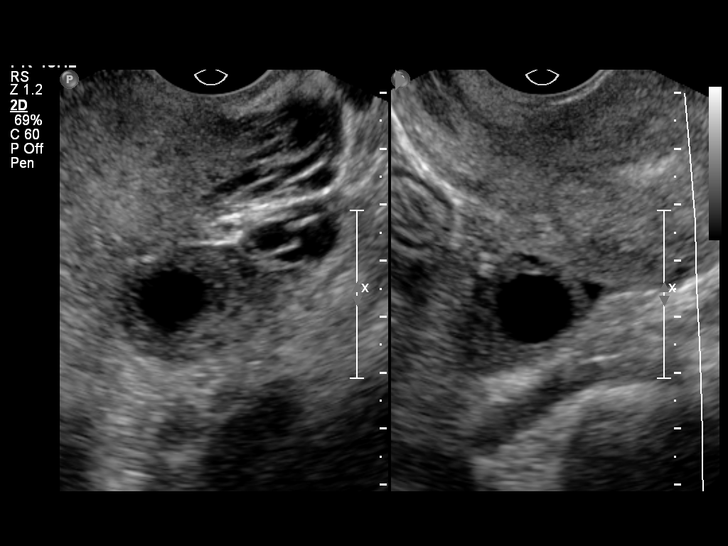
[im 35/35]
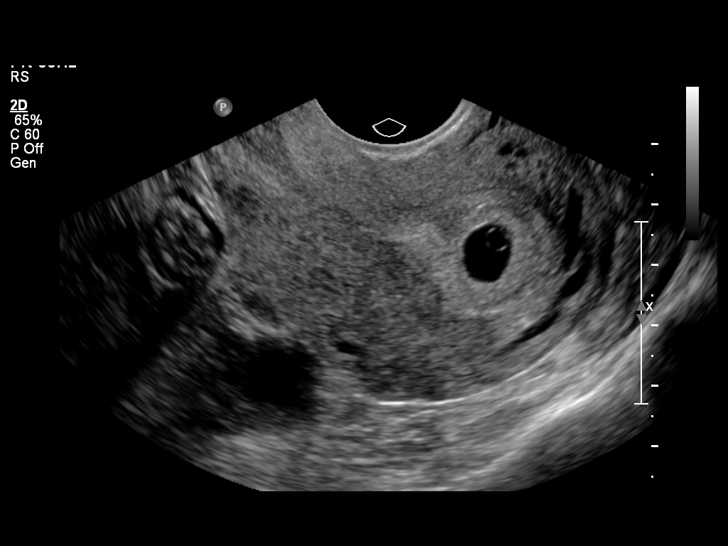

[14 of 28 positions shown; findings below may reference images not displayed]

FINDINGS: Intrauterine gestational sac: Visualized/normal in shape.

Yolk sac:  Present.

Embryo:  Present.

Cardiac Activity: Present.

Heart Rate:  167 bpm

CRL:   0.24  mm   5 w 6 d                  US EDC:

Maternal uterus/adnexae: Uterus is retroverted. No other focal
abnormality noted P
IMPRESSION: Single viable intrauterine pregnancy.

## 2015-01-17 IMAGING — US US FETAL BPP W/O NONSTRESS
1 series · 13 of 26 positions shown · non-contrast
Comparison: none

OBSTETRICS REPORT

Service(s) Provided
 US UA CORD DOPPLER                                    76820.0
Indications
 34 weeks gestation of pregnancy
 Size less than dates (Small for gestational age,
 FGR)
 Poor obstetric history: Previous fetal growth
 restriction (FGR)
Fetal Evaluation
 Num Of Fetuses:    1
 Fetal Heart Rate:  140                          bpm
 Cardiac Activity:  Observed
 Presentation:      Cephalic
 Placenta:          Posterior, above cervical
                    os
 Amniotic Fluid
 AFI FV:      Subjectively within normal limits
Biophysical Evaluation
 Amniotic F.V:   Within normal limits       F. Tone:        Observed
 F. Movement:    Observed                   Score:          [DATE]
 F. Breathing:   Observed
Gestational Age
 LMP:           34w 6d        Date:  06/28/13                 EDD:   04/04/14
 Best:          34w 6d     Det. By:  LMP  (06/28/13)          EDD:   04/04/14
Doppler - Fetal Vessels
 Umbilical Artery
 S/D:   2.6            56  %tile
 Absent DFV:    No     Reverse DFV:    No
Impression
INDICATION: 33 yr old [id number] at 90w8d with lagging fetal
 growth for BPP and Doppler studies. Remote read.

[Series 1: us fetal bpp w/o nonstress · 26 acquisitions, 13 frames shown]
[im 2/26]
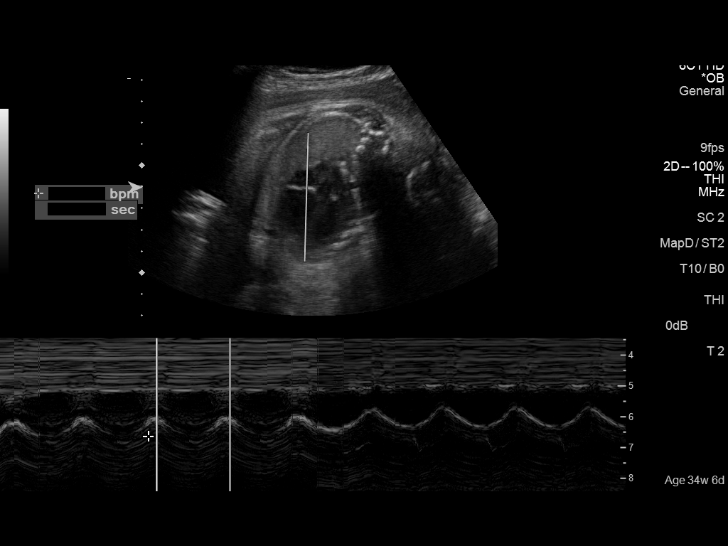
[im 4/26]
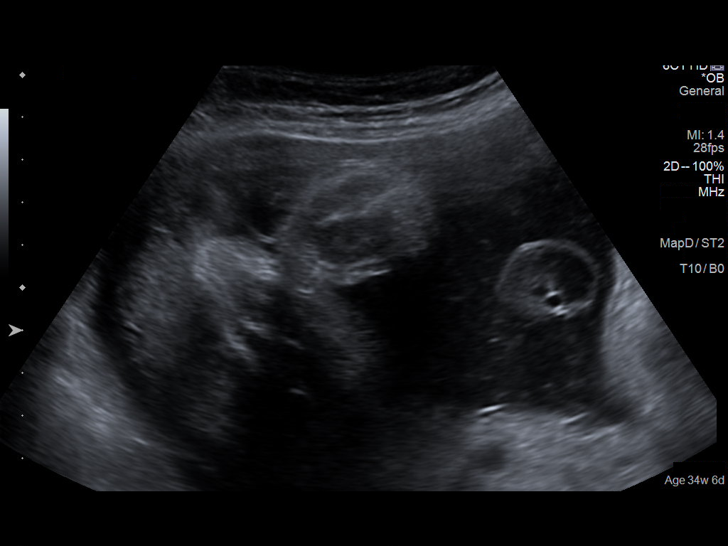
[im 6/26]
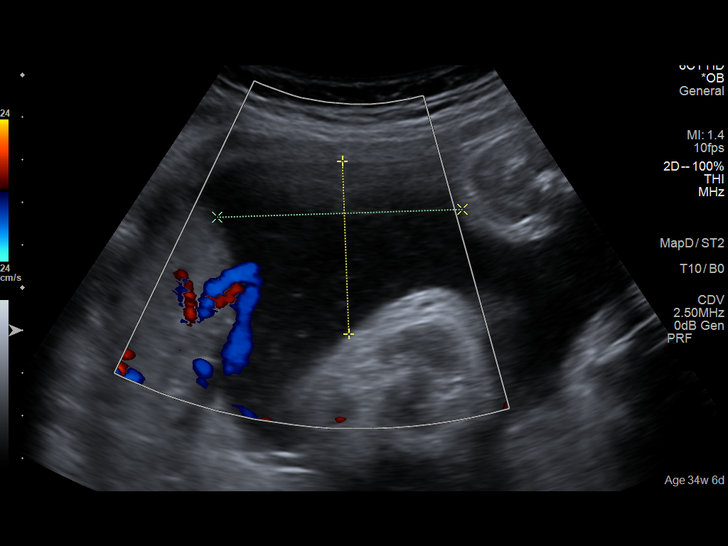
[im 8/26]
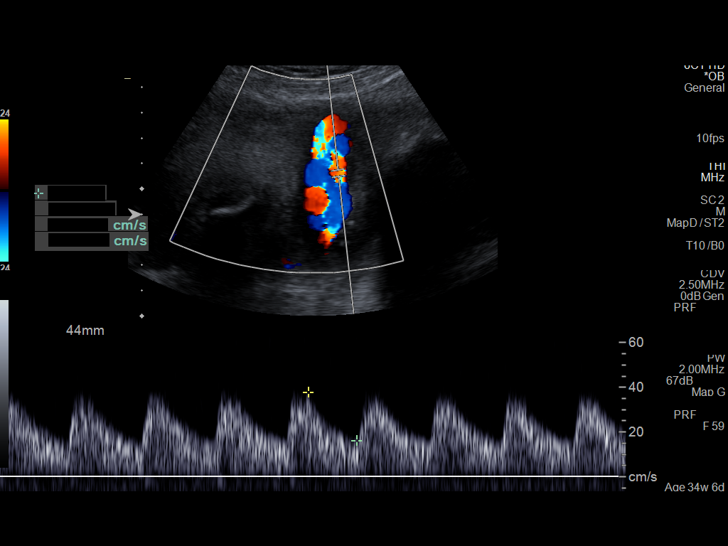
[im 10/26]
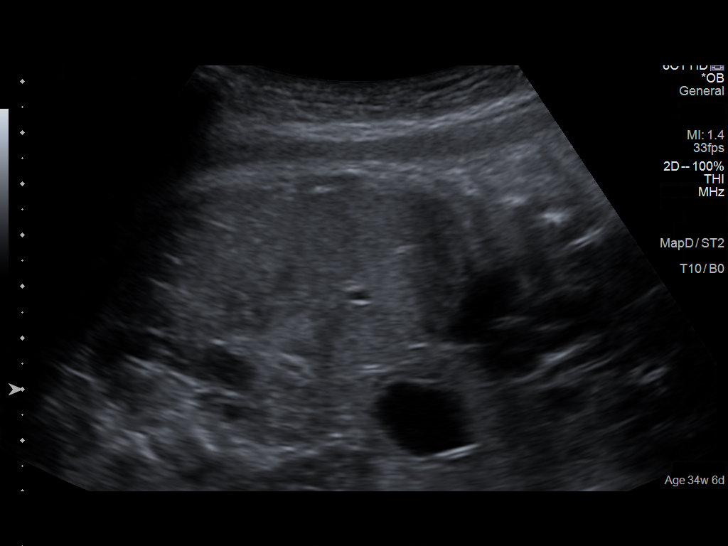
[im 12/26]
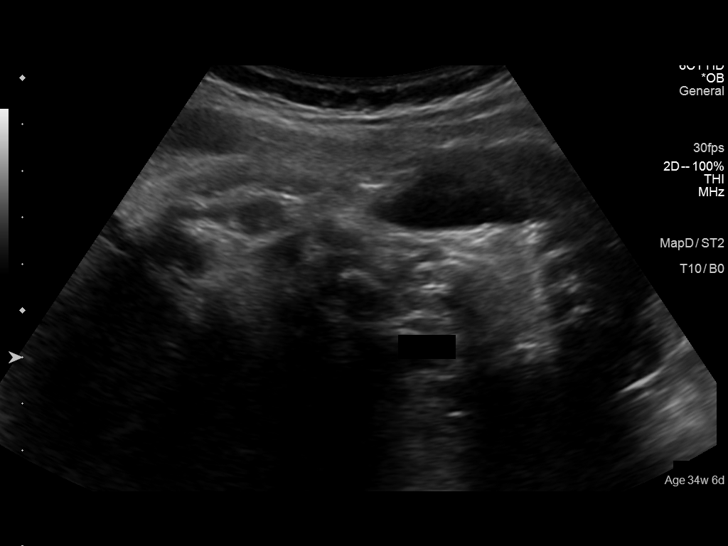
[im 14/26]
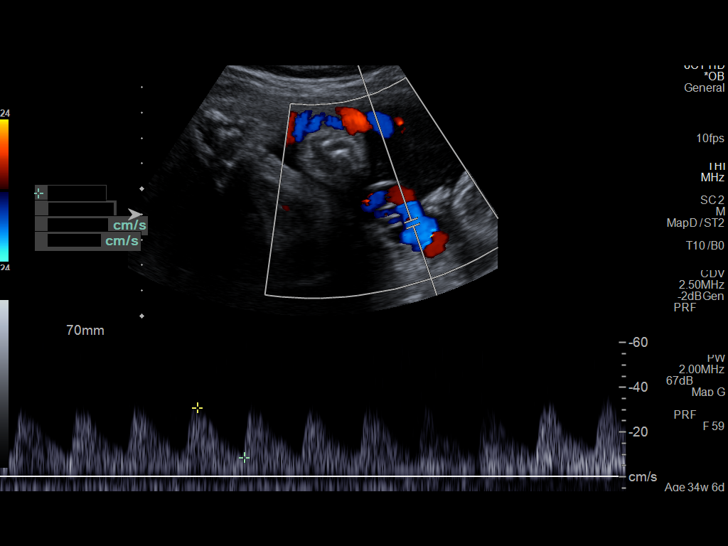
[im 16/26]
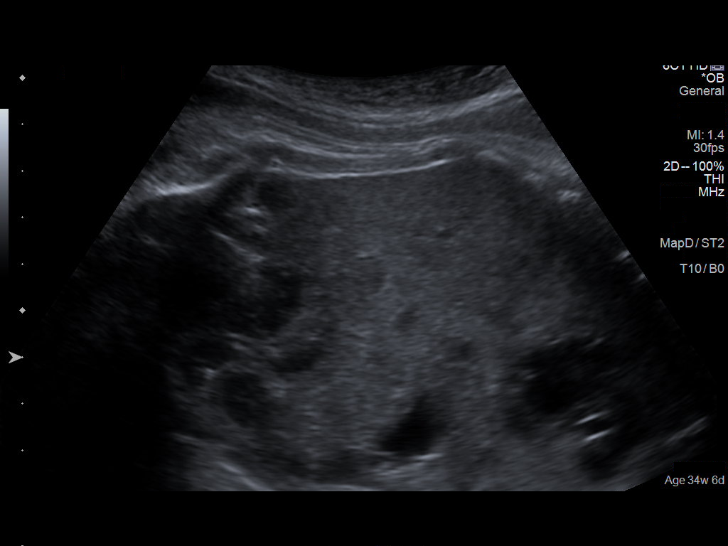
[im 18/26]
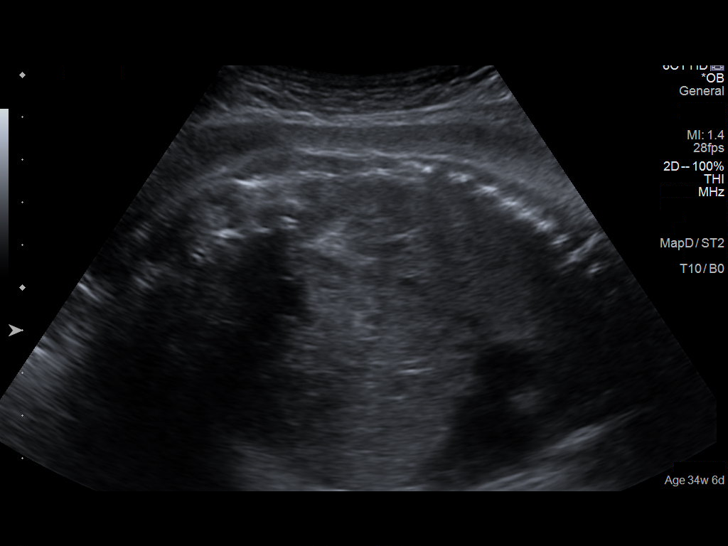
[im 20/26]
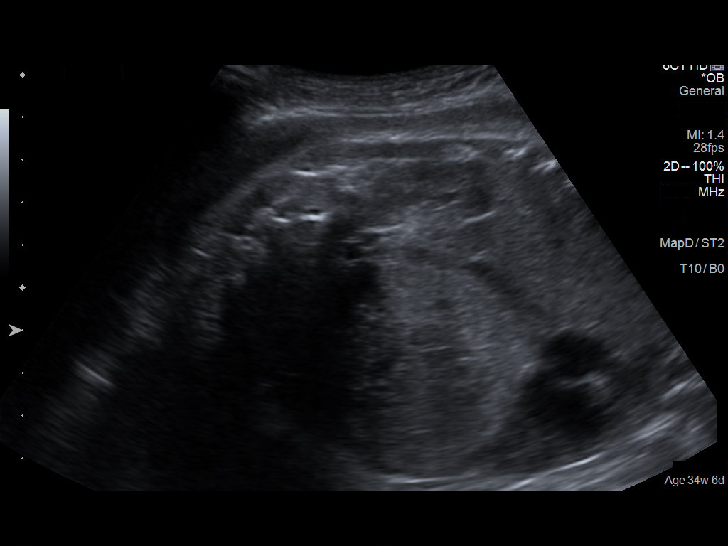
[im 22/26]
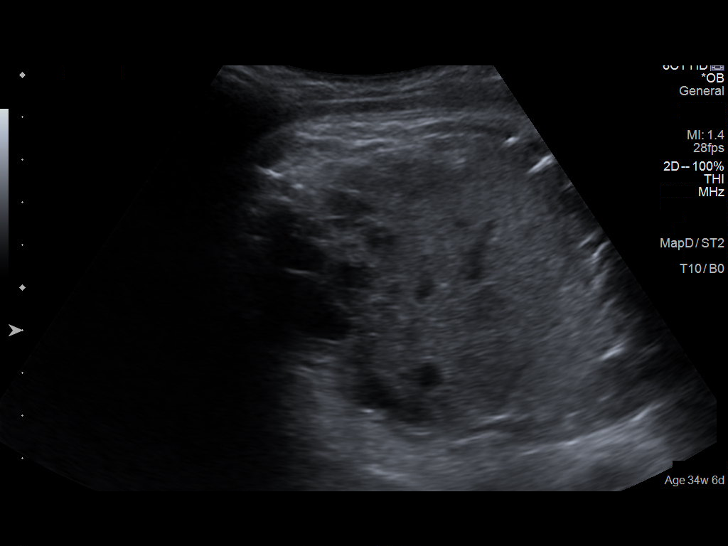
[im 24/26]
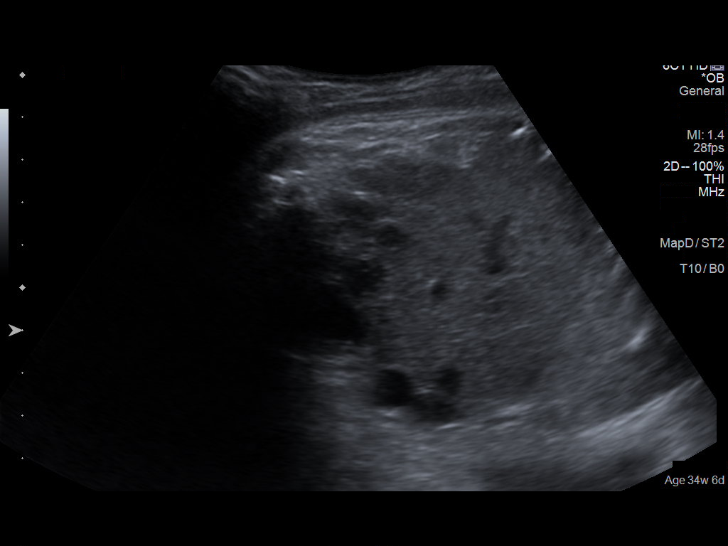
[im 26/26]
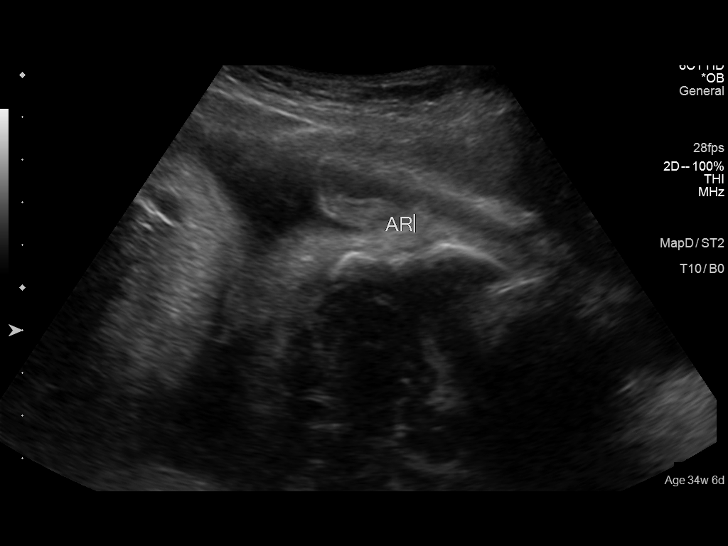

[13 of 26 positions shown; findings below may reference images not displayed]

FINDINGS: 1. Single intrauterine pregnancy.
 2. Posterior placenta without evidence of previa.
 3. Normal amniotic fluid volume.
 4. Normal biophysical profile of [DATE].
 5. Normal umbilical artery Doppler studies.
Recommendations

 1. Lagging fetal growth:
 - recommend continue antenatal testing as previously
 recommended
 - recommend fetal growth in 1 week and further
 recommendations based on fetal growth at that time

 questions or concerns.

## 2022-01-14 ENCOUNTER — Ambulatory Visit (HOSPITAL_COMMUNITY)
Admission: EM | Admit: 2022-01-14 | Discharge: 2022-01-14 | Disposition: A | Discharge status: Home / Self Care | Payer: PRIVATE HEALTH INSURANCE | Attending: Nurse Practitioner | Admitting: Nurse Practitioner

## 2022-01-14 ENCOUNTER — Encounter (HOSPITAL_COMMUNITY): Payer: Self-pay | Admitting: Emergency Medicine

## 2022-01-14 ENCOUNTER — Other Ambulatory Visit: Payer: Self-pay

## 2022-01-14 ENCOUNTER — Ambulatory Visit (INDEPENDENT_AMBULATORY_CARE_PROVIDER_SITE_OTHER): Payer: PRIVATE HEALTH INSURANCE

## 2022-01-14 DIAGNOSIS — M542 Cervicalgia: Secondary | ICD-10-CM

## 2022-01-14 NOTE — Discharge Instructions (Addendum)
Xray is normal CT scan declined due to time. Will have to be ordered with primary care provider. Continue Ibuprofen and Tylenol unless symptoms get worse then follow back up with Urgent care or ER
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
